# Patient Record
Sex: Female | Born: 1950 | Race: Black or African American | Hispanic: No | Smoking: Never smoker
Health system: Southern US, Community
[De-identification: ages and names within clinical notes are randomized; demographics above are authoritative.]

## PROBLEM LIST (undated history)

## (undated) DIAGNOSIS — I5041 Acute combined systolic (congestive) and diastolic (congestive) heart failure: Secondary | ICD-10-CM

## (undated) DIAGNOSIS — E785 Hyperlipidemia, unspecified: Secondary | ICD-10-CM

## (undated) DIAGNOSIS — I428 Other cardiomyopathies: Secondary | ICD-10-CM

## (undated) DIAGNOSIS — B029 Zoster without complications: Secondary | ICD-10-CM

## (undated) DIAGNOSIS — D72819 Decreased white blood cell count, unspecified: Secondary | ICD-10-CM

## (undated) DIAGNOSIS — I10 Essential (primary) hypertension: Secondary | ICD-10-CM

## (undated) DIAGNOSIS — I5042 Chronic combined systolic (congestive) and diastolic (congestive) heart failure: Secondary | ICD-10-CM

## (undated) DIAGNOSIS — R7303 Prediabetes: Secondary | ICD-10-CM

## (undated) HISTORY — DX: Hyperlipidemia, unspecified: E78.5

## (undated) HISTORY — DX: Prediabetes: R73.03

## (undated) HISTORY — PX: TUBAL LIGATION: SHX77

## (undated) HISTORY — DX: Decreased white blood cell count, unspecified: D72.819

## (undated) HISTORY — DX: Chronic combined systolic (congestive) and diastolic (congestive) heart failure: I50.42

## (undated) HISTORY — DX: Zoster without complications: B02.9

## (undated) HISTORY — DX: Other cardiomyopathies: I42.8

## (undated) HISTORY — DX: Acute combined systolic (congestive) and diastolic (congestive) heart failure: I50.41

---

## 2005-02-10 ENCOUNTER — Ambulatory Visit: Payer: Self-pay | Admitting: Gastroenterology

## 2005-03-18 ENCOUNTER — Other Ambulatory Visit: Admission: RE | Admit: 2005-03-18 | Discharge: 2005-03-18 | Payer: Self-pay | Admitting: Family Medicine

## 2005-04-01 ENCOUNTER — Ambulatory Visit (HOSPITAL_COMMUNITY): Admission: RE | Admit: 2005-04-01 | Discharge: 2005-04-01 | Payer: Self-pay | Admitting: Family Medicine

## 2006-08-06 ENCOUNTER — Other Ambulatory Visit: Admission: RE | Admit: 2006-08-06 | Discharge: 2006-08-06 | Payer: Self-pay | Admitting: Family Medicine

## 2006-08-19 ENCOUNTER — Ambulatory Visit (HOSPITAL_COMMUNITY): Admission: RE | Admit: 2006-08-19 | Discharge: 2006-08-19 | Payer: Self-pay | Admitting: Family Medicine

## 2008-03-12 ENCOUNTER — Ambulatory Visit (HOSPITAL_COMMUNITY): Admission: RE | Admit: 2008-03-12 | Discharge: 2008-03-12 | Payer: Self-pay | Admitting: Family Medicine

## 2009-05-01 ENCOUNTER — Other Ambulatory Visit: Admission: RE | Admit: 2009-05-01 | Discharge: 2009-05-01 | Payer: Self-pay | Admitting: Family Medicine

## 2009-10-11 ENCOUNTER — Encounter: Admission: RE | Admit: 2009-10-11 | Discharge: 2009-10-11 | Payer: Self-pay | Admitting: Family Medicine

## 2011-08-27 ENCOUNTER — Other Ambulatory Visit (HOSPITAL_COMMUNITY): Payer: Self-pay | Admitting: Family Medicine

## 2011-08-27 DIAGNOSIS — Z1231 Encounter for screening mammogram for malignant neoplasm of breast: Secondary | ICD-10-CM

## 2011-09-24 ENCOUNTER — Ambulatory Visit (HOSPITAL_COMMUNITY)
Admission: RE | Admit: 2011-09-24 | Discharge: 2011-09-24 | Disposition: A | Discharge status: Home / Self Care | Payer: Commercial Managed Care - PPO | Source: Ambulatory Visit | Attending: Family Medicine | Admitting: Family Medicine

## 2011-09-24 DIAGNOSIS — Z1231 Encounter for screening mammogram for malignant neoplasm of breast: Secondary | ICD-10-CM

## 2012-02-02 ENCOUNTER — Other Ambulatory Visit (HOSPITAL_COMMUNITY)
Admission: RE | Admit: 2012-02-02 | Discharge: 2012-02-02 | Disposition: A | Discharge status: Home / Self Care | Payer: 59 | Source: Ambulatory Visit | Attending: Family Medicine | Admitting: Family Medicine

## 2012-02-02 ENCOUNTER — Other Ambulatory Visit: Payer: Self-pay | Admitting: Family Medicine

## 2012-02-02 DIAGNOSIS — Z124 Encounter for screening for malignant neoplasm of cervix: Secondary | ICD-10-CM | POA: Insufficient documentation

## 2012-03-17 ENCOUNTER — Other Ambulatory Visit: Payer: Self-pay | Admitting: Gastroenterology

## 2013-06-28 ENCOUNTER — Telehealth: Payer: Self-pay | Admitting: Internal Medicine

## 2013-06-28 NOTE — Telephone Encounter (Signed)
S/w pt and gave np appt 01/26 @ 1:30 w/Dr. Arbutus Ped Referring Dr. Shirlean Mylar Dx-Lymphocytosis Welcome packet mailed.

## 2013-06-28 NOTE — Telephone Encounter (Signed)
LVOM FOR PT TO RETURN CALL IN RE TO REFERRAL.  °

## 2013-06-29 ENCOUNTER — Telehealth: Payer: Self-pay | Admitting: Internal Medicine

## 2013-06-29 NOTE — Telephone Encounter (Signed)
C/D 06/29/13 for appt. 07/03/13

## 2013-07-02 ENCOUNTER — Other Ambulatory Visit: Payer: Self-pay | Admitting: Internal Medicine

## 2013-07-02 DIAGNOSIS — D72819 Decreased white blood cell count, unspecified: Secondary | ICD-10-CM

## 2013-07-03 ENCOUNTER — Encounter: Payer: Self-pay | Admitting: Internal Medicine

## 2013-07-03 ENCOUNTER — Telehealth: Payer: Self-pay | Admitting: Internal Medicine

## 2013-07-03 ENCOUNTER — Ambulatory Visit (HOSPITAL_BASED_OUTPATIENT_CLINIC_OR_DEPARTMENT_OTHER): Payer: 59 | Admitting: Internal Medicine

## 2013-07-03 ENCOUNTER — Other Ambulatory Visit: Payer: Self-pay | Admitting: Medical Oncology

## 2013-07-03 ENCOUNTER — Ambulatory Visit (HOSPITAL_BASED_OUTPATIENT_CLINIC_OR_DEPARTMENT_OTHER): Payer: 59

## 2013-07-03 ENCOUNTER — Ambulatory Visit: Payer: 59

## 2013-07-03 VITALS — BP 144/82 | HR 79 | Temp 97.1°F | Resp 18 | Ht 66.0 in | Wt 192.9 lb

## 2013-07-03 DIAGNOSIS — D72819 Decreased white blood cell count, unspecified: Secondary | ICD-10-CM

## 2013-07-03 DIAGNOSIS — E876 Hypokalemia: Secondary | ICD-10-CM

## 2013-07-03 DIAGNOSIS — D72829 Elevated white blood cell count, unspecified: Secondary | ICD-10-CM

## 2013-07-03 DIAGNOSIS — D709 Neutropenia, unspecified: Secondary | ICD-10-CM

## 2013-07-03 LAB — HEPATITIS PANEL, ACUTE
HCV Ab: NEGATIVE
Hep A IgM: NONREACTIVE
Hep B C IgM: NONREACTIVE
Hepatitis B Surface Ag: NEGATIVE

## 2013-07-03 LAB — CBC WITH DIFFERENTIAL/PLATELET
BASO%: 0.4 % (ref 0.0–2.0)
Basophils Absolute: 0 10*3/uL (ref 0.0–0.1)
EOS%: 1.1 % (ref 0.0–7.0)
Eosinophils Absolute: 0 10*3/uL (ref 0.0–0.5)
HCT: 38.5 % (ref 34.8–46.6)
HGB: 12.6 g/dL (ref 11.6–15.9)
LYMPH%: 57.8 % — ABNORMAL HIGH (ref 14.0–49.7)
MCH: 31.1 pg (ref 25.1–34.0)
MCHC: 32.7 g/dL (ref 31.5–36.0)
MCV: 95 fL (ref 79.5–101.0)
MONO#: 0.5 10*3/uL (ref 0.1–0.9)
MONO%: 12.7 % (ref 0.0–14.0)
NEUT#: 1 10*3/uL — ABNORMAL LOW (ref 1.5–6.5)
NEUT%: 28 % — ABNORMAL LOW (ref 38.4–76.8)
Platelets: 270 10*3/uL (ref 145–400)
RBC: 4.05 10*6/uL (ref 3.70–5.45)
RDW: 14 % (ref 11.2–14.5)
WBC: 3.7 10*3/uL — ABNORMAL LOW (ref 3.9–10.3)
lymph#: 2.1 10*3/uL (ref 0.9–3.3)

## 2013-07-03 LAB — COMPREHENSIVE METABOLIC PANEL (CC13)
ALT: 23 U/L (ref 0–55)
AST: 22 U/L (ref 5–34)
Albumin: 3.9 g/dL (ref 3.5–5.0)
Alkaline Phosphatase: 83 U/L (ref 40–150)
Anion Gap: 12 mEq/L — ABNORMAL HIGH (ref 3–11)
BUN: 19 mg/dL (ref 7.0–26.0)
CO2: 29 mEq/L (ref 22–29)
Calcium: 9.5 mg/dL (ref 8.4–10.4)
Chloride: 103 mEq/L (ref 98–109)
Creatinine: 0.9 mg/dL (ref 0.6–1.1)
Glucose: 116 mg/dl (ref 70–140)
Potassium: 3 mEq/L — CL (ref 3.5–5.1)
Sodium: 144 mEq/L (ref 136–145)
Total Bilirubin: 0.65 mg/dL (ref 0.20–1.20)
Total Protein: 7.9 g/dL (ref 6.4–8.3)

## 2013-07-03 LAB — LACTATE DEHYDROGENASE (CC13): LDH: 166 U/L (ref 125–245)

## 2013-07-03 LAB — RHEUMATOID FACTOR: Rheumatoid fact SerPl-aCnc: <10 IU/mL (ref <=14)

## 2013-07-03 MED ORDER — POTASSIUM CHLORIDE CRYS ER 20 MEQ PO TBCR: 20.0000 meq | EXTENDED_RELEASE_TABLET | Freq: Every day | ORAL | Status: DC

## 2013-07-03 NOTE — Patient Instructions (Signed)
Follow up visit in 2 weeks for reevaluation

## 2013-07-03 NOTE — Telephone Encounter (Signed)
gv pt appt schedule for feb °

## 2013-07-03 NOTE — Progress Notes (Signed)
[place] [phone]   [phone]  CONSULT NOTE  REFERRING PHYSICIAN: Dr. Maurice Small  REASON FOR CONSULTATION:  63 years old African American female with Leukocytopenia.  HPI Denise Anderson is a 63 y.o. female with a past medical history significant for hypertension. She was recently started care with Dr. Justin Mend after her previous primary care physician left the practice. She was seen by Dr. Justin Mend in October of 2014 and CBC performed on 03/21/2013 showed total white blood count of 3.5 with absolute neutrophil count of 1300 and absolute lymphocyte count of 1600. Repeat CBC on 06/26/2013 showed persistent leukocytopenia with total white blood count of 3.9, absolute neutrophil count 1200 and absolute lymphocyte count 1900. The patient was referred to me today for evaluation and recommendation regarding these abnormalities. The patient is feeling fine and she has no significant complaints. She denied having any history of rheumatoid arthritis or lupus. She denied having any history of HIV or hepatitis. The patient has no significant weight loss or night sweats. She denied having any significant chest pain, shortness of breath, cough or hemoptysis. She has no nausea or vomiting. She has no fatigue or weakness.  PAST MEDICAL HISTORY: significant for hypertension, status post tubal ligation and history of shingles.  FAMILY HISTORY: Significant for father and mother died from heart attacks and brother had a stroke.  SOCIAL HISTORY: The patient is single and has 2 adult children. She works as a Statistician. She denied having any history of smoking but drinks alcohol occasionally and no history of drug abuse. HPI   ALLERGIES:  No Known Allergies  Current Outpatient Prescriptions  Medication Sig Dispense Refill  . atenolol-chlorthalidone (TENORETIC) 50-25 MG per tablet Take 1 tablet by mouth daily.      . Cholecalciferol (VITAMIN D3) 3000 UNITS TABS Take  2,000 mcg by mouth daily.      Marland Kitchen L-Lysine 1000 MG TABS Take 1,500 mg by mouth daily.      . Multiple Vitamin (MULTIVITAMIN) tablet Take 1 tablet by mouth daily.      . Multiple Vitamins-Minerals (OCUVITE ADULT 50+ PO) Take 1 tablet by mouth daily.      Marland Kitchen zinc gluconate 50 MG tablet Take 50 mg by mouth daily.       No current facility-administered medications for this visit.    Review of Systems  Constitutional: negative Eyes: negative Ears, nose, mouth, throat, and face: negative Respiratory: negative Cardiovascular: negative Gastrointestinal: negative Genitourinary:negative Integument/breast: negative Hematologic/lymphatic: negative Musculoskeletal:negative Neurological: negative Behavioral/Psych: negative Endocrine: negative Allergic/Immunologic: negative  Physical Exam  OZY:YQMGN, healthy, no distress, well nourished and well developed SKIN: skin color, texture, turgor are normal, no rashes or significant lesions HEAD: Normocephalic, No masses, lesions, tenderness or abnormalities EYES: normal, PERRLA EARS: External ears normal OROPHARYNX:no exudate, no erythema and lips, buccal mucosa, and tongue normal  NECK: supple, no adenopathy, no JVD LYMPH:  no palpable lymphadenopathy, no hepatosplenomegaly BREAST:not examined LUNGS: clear to auscultation , and palpation HEART: regular rate & rhythm, no murmurs and no gallops ABDOMEN:abdomen soft, non-tender, normal bowel sounds and no masses or organomegaly BACK: Back symmetric, no curvature. EXTREMITIES:no joint deformities, effusion, or inflammation, no edema, no skin discoloration  NEURO: alert & oriented x 3 with fluent speech, no focal motor/sensory deficits  PERFORMANCE STATUS: ECOG 0  LABORATORY DATA: Lab Results  Component Value Date   WBC 3.7* 07/03/2013   HGB 12.6 07/03/2013   HCT 38.5 07/03/2013   MCV 95.0 07/03/2013  PLT 270 07/03/2013      Chemistry      Component Value Date/Time   NA 144 07/03/2013 1403     K 3.0* 07/03/2013 1403   CO2 29 07/03/2013 1403   BUN 19.0 07/03/2013 1403   CREATININE 0.9 07/03/2013 1403      Component Value Date/Time   CALCIUM 9.5 07/03/2013 1403   ALKPHOS 83 07/03/2013 1403   AST 22 07/03/2013 1403   ALT 23 07/03/2013 1403   BILITOT 0.65 07/03/2013 1403       RADIOGRAPHIC STUDIES: No results found.  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 Other etiologies cannot be excluded at this point.  PLAN: I ordered several studies to evaluate her condition including repeat CBC, comprehensive metabolic panel, LDH, serum folate, vitamin B 12, ANA, rheumatoid factor, hepatitis panel and HIV antibody.  I would see the patient back for follow up visit in 2 weeks for evaluation and discussion of the lab results and further recommendation regarding her condition. I may consider her for a bone marrow biopsy and aspirate to rule out any other underlying bone marrow abnormality if she continues to have persistent leukocytopenia and neutropenia. The patient is currently asymptomatic and she would continue on observation for now. She was advised to call immediately if she has any concerning symptoms in the interval.  The patient voices understanding of current disease status and treatment options and is in agreement with the current care plan.  All questions were answered. The patient knows to call the clinic with any problems, questions or concerns. We can certainly see the patient much sooner if necessary.  Thank you so much for allowing me to participate in the care of Denise Anderson. I will continue to follow up the patient with you and assist in her care.  I spent 35 minutes counseling the patient face to face. The total time spent in the appointment was 55 minutes.  Disclaimer: This note was dictated with voice recognition software. Similar sounding words  can inadvertently be transcribed and may not be corrected upon review.   Gust Eugene K. 07/03/2013, 3:25 PM

## 2013-07-03 NOTE — Telephone Encounter (Signed)
Called in kdur and pt notified.

## 2013-07-04 LAB — FOLATE: Folate: >20 ng/mL

## 2013-07-04 LAB — ANTI-NUCLEAR AB-TITER (ANA TITER): ANA Titer 1: 1:40 {titer} — ABNORMAL HIGH

## 2013-07-04 LAB — VITAMIN B12: Vitamin B-12: 726 pg/mL (ref 211–911)

## 2013-07-04 LAB — ANA: Anti Nuclear Antibody(ANA): POSITIVE — AB

## 2013-07-04 LAB — HIV ANTIBODY (ROUTINE TESTING W REFLEX): HIV: NONREACTIVE

## 2013-07-10 ENCOUNTER — Ambulatory Visit: Payer: 59 | Admitting: Internal Medicine

## 2013-07-10 ENCOUNTER — Ambulatory Visit: Payer: 59

## 2013-07-10 ENCOUNTER — Other Ambulatory Visit: Payer: 59

## 2013-07-18 ENCOUNTER — Telehealth: Payer: Self-pay | Admitting: Internal Medicine

## 2013-07-18 ENCOUNTER — Ambulatory Visit (HOSPITAL_BASED_OUTPATIENT_CLINIC_OR_DEPARTMENT_OTHER): Payer: 59 | Admitting: Physician Assistant

## 2013-07-18 ENCOUNTER — Other Ambulatory Visit (HOSPITAL_BASED_OUTPATIENT_CLINIC_OR_DEPARTMENT_OTHER): Payer: 59

## 2013-07-18 ENCOUNTER — Encounter: Payer: Self-pay | Admitting: Physician Assistant

## 2013-07-18 VITALS — BP 139/87 | HR 79 | Temp 97.2°F | Resp 20 | Ht 66.0 in | Wt 186.6 lb

## 2013-07-18 DIAGNOSIS — D72819 Decreased white blood cell count, unspecified: Secondary | ICD-10-CM

## 2013-07-18 DIAGNOSIS — D72820 Lymphocytosis (symptomatic): Secondary | ICD-10-CM

## 2013-07-18 DIAGNOSIS — D709 Neutropenia, unspecified: Secondary | ICD-10-CM

## 2013-07-18 DIAGNOSIS — D61818 Other pancytopenia: Secondary | ICD-10-CM

## 2013-07-18 LAB — CBC WITH DIFFERENTIAL/PLATELET
BASO%: 0.3 % (ref 0.0–2.0)
Basophils Absolute: 0 10*3/uL (ref 0.0–0.1)
EOS%: 0.8 % (ref 0.0–7.0)
Eosinophils Absolute: 0 10*3/uL (ref 0.0–0.5)
HCT: 37.4 % (ref 34.8–46.6)
HGB: 12.3 g/dL (ref 11.6–15.9)
LYMPH%: 50 % — ABNORMAL HIGH (ref 14.0–49.7)
MCH: 31.4 pg (ref 25.1–34.0)
MCHC: 33 g/dL (ref 31.5–36.0)
MCV: 95 fL (ref 79.5–101.0)
MONO#: 0.7 10*3/uL (ref 0.1–0.9)
MONO%: 20.3 % — ABNORMAL HIGH (ref 0.0–14.0)
NEUT#: 1 10*3/uL — ABNORMAL LOW (ref 1.5–6.5)
NEUT%: 28.6 % — ABNORMAL LOW (ref 38.4–76.8)
Platelets: 258 10*3/uL (ref 145–400)
RBC: 3.93 10*6/uL (ref 3.70–5.45)
RDW: 14 % (ref 11.2–14.5)
WBC: 3.4 10*3/uL — ABNORMAL LOW (ref 3.9–10.3)
lymph#: 1.7 10*3/uL (ref 0.9–3.3)

## 2013-07-18 NOTE — Progress Notes (Addendum)
No images are attached to the encounter. No scans are attached to the encounter. No scans are attached to the encounter. [place] SHARED VISIT PROGRESS NOTE  WEBB, Denise Leaver, MD [address] [place] [address]  DIAGNOSIS: Leukocytopenia and neutropenia with relative lymphocytosis  PRIOR THERAPY: None  CURRENT THERAPY: Observation  INTERVAL HISTORY: Denise Anderson 63 y.o. 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 She was evaluated by Dr. Julien Anderson regarding her history of leukopenia and neutropenia. She presents today to discuss the results of several laboratory studies that were drawn as well as further management of her condition. She feels well and has started working out at [employer] and is working on losing some weight. She's had no issues with night sweats has noticed no lymph node swelling no significant weight gain or weight loss. She is content to remain on observation and does not wish to pursue bone marrow biopsy at this time. Of note the laboratory studies including LDH serum folate vitamin B 12 rheumatoid factor hepatitis panel anytime the panel are were all negative. Her ANA came back only marginally positive with a titer of 1:40, which is nonspecific.  MEDICAL HISTORY:History reviewed. No pertinent past medical history.  ALLERGIES:  has No Known Allergies.  MEDICATIONS:  Current Outpatient Prescriptions  Medication Sig Dispense Refill  . atenolol-chlorthalidone (TENORETIC) 50-25 MG per tablet Take 1 tablet by mouth daily.      . Cholecalciferol (VITAMIN D3) 3000 UNITS TABS Take 2,000 mcg by mouth daily.      Marland Kitchen L-Lysine 1000 MG TABS Take 1,500 mg by mouth daily.      . Multiple Vitamin (MULTIVITAMIN) tablet Take 1 tablet by mouth daily.      . Multiple Vitamins-Minerals (OCUVITE ADULT 50+ PO) Take 1 tablet by mouth daily.      Marland Kitchen zinc gluconate 50 MG tablet Take 50 mg by mouth daily.      .  potassium chloride SA (K-DUR,KLOR-CON) 20 MEQ tablet Take 1 tablet (20 mEq total) by mouth daily.  7 tablet  0   No current facility-administered medications for this visit.    SURGICAL HISTORY: History reviewed. No pertinent past surgical history.  REVIEW OF SYSTEMS:  Constitutional: negative Eyes: negative Ears, nose, mouth, throat, and face: negative Respiratory: negative Cardiovascular: negative Gastrointestinal: negative Genitourinary:negative Integument/breast: negative Hematologic/lymphatic: negative Musculoskeletal:negative Neurological: negative Behavioral/Psych: negative Endocrine: negative Allergic/Immunologic: negative   PHYSICAL EXAMINATION: General appearance: alert, cooperative, no distress and appears younger than stated age Head: Normocephalic, without obvious abnormality, atraumatic Neck: no adenopathy, no carotid bruit, no JVD, supple, symmetrical, trachea midline and thyroid not enlarged, symmetric, no tenderness/mass/nodules Lymph nodes: Cervical, supraclavicular, and axillary nodes normal. Resp: clear to auscultation bilaterally Back: symmetric, no curvature. ROM normal. No CVA tenderness. Cardio: regular rate and rhythm, S1, S2 normal, no murmur, click, rub or gallop GI: soft, non-tender; bowel sounds normal; no masses,  no organomegaly Extremities: extremities normal, atraumatic, no cyanosis or edema Neurologic: Alert and oriented X 3, normal strength and tone. Normal symmetric reflexes. Normal coordination and gait  ECOG PERFORMANCE STATUS: 0 - Asymptomatic  Blood pressure 139/87, pulse 79, temperature 97.2 F (36.2 C), temperature source Oral, resp. rate 20, height '5\' 6"'  (1.676 m), weight 186 lb 9.6 oz (84.641 kg), SpO2 99.00%.  LABORATORY DATA: Lab Results  Component Value Date   WBC 3.4* 07/18/2013   HGB 12.3 07/18/2013   HCT 37.4 07/18/2013   MCV  95.0 07/18/2013   PLT 258 07/18/2013      Chemistry      Component Value Date/Time   NA 144 07/03/2013  1403   K 3.0* 07/03/2013 1403   CO2 29 07/03/2013 1403   BUN 19.0 07/03/2013 1403   CREATININE 0.9 07/03/2013 1403      Component Value Date/Time   CALCIUM 9.5 07/03/2013 1403   ALKPHOS 83 07/03/2013 1403   AST 22 07/03/2013 1403   ALT 23 07/03/2013 1403   BILITOT 0.65 07/03/2013 1403       RADIOGRAPHIC STUDIES:  No results found.   ASSESSMENT/PLAN: The patient is a very pleasant 63 year old [place] American female with a history of lymphocytosis. She's had no issues with fever or or prolonged illnesses. The laboratory study results were reviewed the patient. The patient was discussed with an also seen by Dr. Julien Anderson. We will plan to keep the patient on observation and have her return in 3 months with a repeat CBC differential and LDH. We will monitor the trend of her white count and absolute neutrophil count. If these continue to to trend downward the patient understands the bone marrow biopsy will be necessary. She is in agreement with this plan and will return in 3 months for another symptom management visit and repeat labs as stated above.     Denise Anderson, Denise Krakow E, PA-C     All questions were answered. The patient knows to call the clinic with any problems, questions or concerns. We can certainly see the patient much sooner if necessary.  ADDENDUM: Hematology/Oncology Attending: I had the face to face encounter with the patient today. I recommended her care plan. This is a very pleasant 63 years old [place] American female evaluated for pancytopenia and neutropenia with relative lymphocytosis. The previous bloodwork that was performed to evaluate her condition were negative except for mildly positive ANA. I discussed with the patient her lab results and give her the option of continuous observation versus proceeding with a bone marrow biopsy and aspirate. The patient would like to continue on observation for now. I would see her back for followup visit in 3 months with repeat CBC and  LDH. She was advised to call immediately she has any concerning symptoms in the interval.  Disclaimer: This note was dictated with voice recognition software. Similar sounding words can inadvertently be transcribed and may not be corrected upon review. Eilleen Kempf., MD 07/18/2013

## 2013-07-18 NOTE — Patient Instructions (Signed)
Follow up in 3 months

## 2013-07-18 NOTE — Telephone Encounter (Signed)
gave pt appt for lab and md on May 2015

## 2013-10-17 ENCOUNTER — Ambulatory Visit (HOSPITAL_BASED_OUTPATIENT_CLINIC_OR_DEPARTMENT_OTHER): Payer: 59 | Admitting: Internal Medicine

## 2013-10-17 ENCOUNTER — Telehealth: Payer: Self-pay | Admitting: Internal Medicine

## 2013-10-17 ENCOUNTER — Encounter: Payer: Self-pay | Admitting: Internal Medicine

## 2013-10-17 ENCOUNTER — Other Ambulatory Visit (HOSPITAL_BASED_OUTPATIENT_CLINIC_OR_DEPARTMENT_OTHER): Payer: 59

## 2013-10-17 VITALS — BP 140/80 | HR 67 | Temp 98.1°F | Resp 18 | Ht 66.0 in | Wt 169.1 lb

## 2013-10-17 DIAGNOSIS — D709 Neutropenia, unspecified: Secondary | ICD-10-CM

## 2013-10-17 DIAGNOSIS — D72820 Lymphocytosis (symptomatic): Secondary | ICD-10-CM

## 2013-10-17 DIAGNOSIS — D72819 Decreased white blood cell count, unspecified: Secondary | ICD-10-CM

## 2013-10-17 LAB — CBC WITH DIFFERENTIAL/PLATELET
BASO%: 0.3 % (ref 0.0–2.0)
Basophils Absolute: 0 10*3/uL (ref 0.0–0.1)
EOS%: 1.3 % (ref 0.0–7.0)
Eosinophils Absolute: 0 10*3/uL (ref 0.0–0.5)
HCT: 39.3 % (ref 34.8–46.6)
HGB: 13 g/dL (ref 11.6–15.9)
LYMPH%: 47.9 % (ref 14.0–49.7)
MCH: 30.7 pg (ref 25.1–34.0)
MCHC: 33.1 g/dL (ref 31.5–36.0)
MCV: 92.9 fL (ref 79.5–101.0)
MONO#: 0.6 10*3/uL (ref 0.1–0.9)
MONO%: 18.5 % — ABNORMAL HIGH (ref 0.0–14.0)
NEUT#: 1 10*3/uL — ABNORMAL LOW (ref 1.5–6.5)
NEUT%: 32 % — ABNORMAL LOW (ref 38.4–76.8)
Platelets: 262 10*3/uL (ref 145–400)
RBC: 4.23 10*6/uL (ref 3.70–5.45)
RDW: 13.6 % (ref 11.2–14.5)
WBC: 3 10*3/uL — ABNORMAL LOW (ref 3.9–10.3)
lymph#: 1.5 10*3/uL (ref 0.9–3.3)

## 2013-10-17 LAB — LACTATE DEHYDROGENASE (CC13): LDH: 151 U/L (ref 125–245)

## 2013-10-17 NOTE — Progress Notes (Signed)
[place] [phone]   [phone]  OFFICE PROGRESS NOTE  Jonathon Bellows, MD [address] Denise Anderson [address]  DIAGNOSIS: Leukocytopenia and neutropenia with relative lymphocytosis   PRIOR THERAPY: None   CURRENT THERAPY: Observation  INTERVAL HISTORY: Denise Anderson 63 y.o. female returns to the clinic today for routine three-month followup visit. The patient is feeling fine today with no specific complaints. She denied having any significant fever or chills. She has no recurrent infection. She has no chest pain, shortness breath, cough or hemoptysis. The patient denied having any significant weight loss or night sweats. She is very active and goes to the gym at regular basis for exercise. She had repeat CBC performed earlier today and she is here for evaluation and discussion of her lab results.   ALLERGIES:  has No Known Allergies.  MEDICATIONS:  Current Outpatient Prescriptions  Medication Sig Dispense Refill  . atenolol-chlorthalidone (TENORETIC) 50-25 MG per tablet Take 1 tablet by mouth daily.      . Cholecalciferol (VITAMIN D3) 3000 UNITS TABS Take 2,000 mcg by mouth daily.      Marland Kitchen L-Lysine 1000 MG TABS Take 1,500 mg by mouth daily.      . Multiple Vitamin (MULTIVITAMIN) tablet Take 1 tablet by mouth daily.      . Multiple Vitamins-Minerals (OCUVITE ADULT 50+ PO) Take 1 tablet by mouth daily.      . potassium chloride SA (K-DUR,KLOR-CON) 20 MEQ tablet Take 1 tablet (20 mEq total) by mouth daily.  7 tablet  0  . zinc gluconate 50 MG tablet Take 50 mg by mouth daily.       No current facility-administered medications for this visit.    REVIEW OF SYSTEMS:  A comprehensive review of systems was negative.   PHYSICAL EXAMINATION: General appearance: alert, cooperative and no distress Head: Normocephalic, without obvious abnormality, atraumatic Neck: no adenopathy, no JVD, supple, symmetrical, trachea midline and  thyroid not enlarged, symmetric, no tenderness/mass/nodules Lymph nodes: Cervical, supraclavicular, and axillary nodes normal. Resp: clear to auscultation bilaterally Back: symmetric, no curvature. ROM normal. No CVA tenderness. Cardio: regular rate and rhythm, S1, S2 normal, no murmur, click, rub or gallop GI: soft, non-tender; bowel sounds normal; no masses,  no organomegaly Extremities: extremities normal, atraumatic, no cyanosis or edema  ECOG PERFORMANCE STATUS: 0 - Asymptomatic  There were no vitals taken for this visit.  LABORATORY DATA: Lab Results  Component Value Date   WBC 3.0* 10/17/2013   HGB 13.0 10/17/2013   HCT 39.3 10/17/2013   MCV 92.9 10/17/2013   PLT 262 10/17/2013      Chemistry      Component Value Date/Time   NA 144 07/03/2013 1403   K 3.0* 07/03/2013 1403   CO2 29 07/03/2013 1403   BUN 19.0 07/03/2013 1403   CREATININE 0.9 07/03/2013 1403      Component Value Date/Time   CALCIUM 9.5 07/03/2013 1403   ALKPHOS 83 07/03/2013 1403   AST 22 07/03/2013 1403   ALT 23 07/03/2013 1403   BILITOT 0.65 07/03/2013 1403       RADIOGRAPHIC STUDIES: No results found.  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 I discussed with the patient again today proceeding with a bone marrow biopsy and aspirate but she declined. I will continue her on observation with repeat CBC and LDH in 3 months.  She was advised to call me immediately if she has any concerning symptoms and interval. The patient voices understanding of current disease status and treatment options and is in agreement with the current care plan.  All questions were answered. The patient knows to call the clinic with any problems, questions or concerns. We can certainly see the patient much sooner if necessary.  Disclaimer: This note was dictated with voice recognition software. Similar sounding words can  inadvertently be transcribed and may not be corrected upon review.

## 2013-10-17 NOTE — Telephone Encounter (Signed)
per pof to sch appt-gave pt copy of appt °

## 2014-01-17 ENCOUNTER — Ambulatory Visit (HOSPITAL_BASED_OUTPATIENT_CLINIC_OR_DEPARTMENT_OTHER): Payer: 59 | Admitting: Internal Medicine

## 2014-01-17 ENCOUNTER — Telehealth: Payer: Self-pay | Admitting: Internal Medicine

## 2014-01-17 ENCOUNTER — Other Ambulatory Visit (HOSPITAL_BASED_OUTPATIENT_CLINIC_OR_DEPARTMENT_OTHER): Payer: 59

## 2014-01-17 ENCOUNTER — Encounter: Payer: Self-pay | Admitting: Internal Medicine

## 2014-01-17 VITALS — BP 125/73 | HR 68 | Temp 98.2°F | Resp 18 | Ht 66.0 in | Wt 168.4 lb

## 2014-01-17 DIAGNOSIS — D72819 Decreased white blood cell count, unspecified: Secondary | ICD-10-CM

## 2014-01-17 DIAGNOSIS — D709 Neutropenia, unspecified: Secondary | ICD-10-CM

## 2014-01-17 LAB — CBC WITH DIFFERENTIAL/PLATELET
BASO%: 0.3 % (ref 0.0–2.0)
Basophils Absolute: 0 10*3/uL (ref 0.0–0.1)
EOS%: 0.9 % (ref 0.0–7.0)
Eosinophils Absolute: 0 10*3/uL (ref 0.0–0.5)
HCT: 38.9 % (ref 34.8–46.6)
HGB: 12.6 g/dL (ref 11.6–15.9)
LYMPH%: 52.3 % — ABNORMAL HIGH (ref 14.0–49.7)
MCH: 30.7 pg (ref 25.1–34.0)
MCHC: 32.4 g/dL (ref 31.5–36.0)
MCV: 95 fL (ref 79.5–101.0)
MONO#: 0.5 10*3/uL (ref 0.1–0.9)
MONO%: 14.4 % — ABNORMAL HIGH (ref 0.0–14.0)
NEUT#: 1.1 10*3/uL — ABNORMAL LOW (ref 1.5–6.5)
NEUT%: 32.1 % — ABNORMAL LOW (ref 38.4–76.8)
Platelets: 292 10*3/uL (ref 145–400)
RBC: 4.09 10*6/uL (ref 3.70–5.45)
RDW: 14.3 % (ref 11.2–14.5)
WBC: 3.4 10*3/uL — ABNORMAL LOW (ref 3.9–10.3)
lymph#: 1.8 10*3/uL (ref 0.9–3.3)

## 2014-01-17 LAB — LACTATE DEHYDROGENASE (CC13): LDH: 144 U/L (ref 125–245)

## 2014-01-17 NOTE — Telephone Encounter (Signed)
gv and printed appt sched and avs for pt for Feb 2016 °

## 2014-01-17 NOTE — Progress Notes (Signed)
[facility] [phone]   [phone]  OFFICE PROGRESS NOTE  Frederich ChickWEBB, CAROL, D, MD [address] [place] [id number]  DIAGNOSIS: Leukocytopenia and neutropenia with relative lymphocytosis   PRIOR THERAPY: None   CURRENT THERAPY: Observation  INTERVAL HISTORY: Denise Anderson 63 y.o. female returns to the clinic today for routine three-month followup visit. The patient is feeling fine today with no specific complaints. She denied having any significant fever or chills. She has no recent infection. She has no chest pain, shortness breath, cough or hemoptysis. The patient denied having any significant weight loss or night sweats. She had repeat CBC performed earlier today and she is here for evaluation and discussion of her lab results.   ALLERGIES:  has No Known Allergies.  MEDICATIONS:  Current Outpatient Prescriptions  Medication Sig Dispense Refill  . atenolol-chlorthalidone (TENORETIC) 50-25 MG per tablet Take 1 tablet by mouth daily.      Marland Kitchen. L-Lysine 1000 MG TABS Take 1,500 mg by mouth daily.      . Multiple Vitamin (MULTIVITAMIN) tablet Take 1 tablet by mouth daily.      . Multiple Vitamins-Minerals (OCUVITE ADULT 50+ PO) Take 1 tablet by mouth daily.      . Cholecalciferol (VITAMIN D3) 3000 UNITS TABS Take 2,000 mcg by mouth daily.      . potassium chloride SA (K-DUR,KLOR-CON) 20 MEQ tablet Take 1 tablet (20 mEq total) by mouth daily.  7 tablet  0  . zinc gluconate 50 MG tablet Take 50 mg by mouth daily.       No current facility-administered medications for this visit.    REVIEW OF SYSTEMS:  A comprehensive review of systems was negative.   PHYSICAL EXAMINATION: General appearance: alert, cooperative and no distress Head: Normocephalic, without obvious abnormality, atraumatic Neck: no adenopathy, no JVD, supple, symmetrical, trachea midline and thyroid not enlarged, symmetric, no tenderness/mass/nodules Lymph nodes:  Cervical, supraclavicular, and axillary nodes normal. Resp: clear to auscultation bilaterally Back: symmetric, no curvature. ROM normal. No CVA tenderness. Cardio: regular rate and rhythm, S1, S2 normal, no murmur, click, rub or gallop GI: soft, non-tender; bowel sounds normal; no masses,  no organomegaly Extremities: extremities normal, atraumatic, no cyanosis or edema  ECOG PERFORMANCE STATUS: 0 - Asymptomatic  Blood pressure 125/73, pulse 68, temperature 98.2 F (36.8 C), resp. rate 18, height 5\' 6"  (1.676 m), weight 168 lb 6.4 oz (76.386 kg), SpO2 100.00%.  LABORATORY DATA: Lab Results  Component Value Date   WBC 3.4* 01/17/2014   HGB 12.6 01/17/2014   HCT 38.9 01/17/2014   MCV 95.0 01/17/2014   PLT 292 01/17/2014      Chemistry      Component Value Date/Time   NA 144 07/03/2013 1403   K 3.0* 07/03/2013 1403   CO2 29 07/03/2013 1403   BUN 19.0 07/03/2013 1403   CREATININE 0.9 07/03/2013 1403      Component Value Date/Time   CALCIUM 9.5 07/03/2013 1403   ALKPHOS 83 07/03/2013 1403   AST 22 07/03/2013 1403   ALT 23 07/03/2013 1403   BILITOT 0.65 07/03/2013 1403       RADIOGRAPHIC STUDIES: No results found.  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 Her CBC is stable today but still showing mild Leukocytopenia and neutropenia I will continue her on observation with repeat CBC, comprehensive metabolic panel and LDH in  6 months. She was advised to call me immediately if she has any concerning symptoms and interval. The patient voices understanding of current disease status and treatment options and is in agreement with the current care plan.  All questions were answered. The patient knows to call the clinic with any problems, questions or concerns. We can certainly see the patient much sooner if necessary.  Disclaimer: This note was dictated with voice recognition software.  Similar sounding words can inadvertently be transcribed and may not be corrected upon review.

## 2014-07-18 ENCOUNTER — Other Ambulatory Visit (HOSPITAL_BASED_OUTPATIENT_CLINIC_OR_DEPARTMENT_OTHER): Payer: Commercial Managed Care - PPO

## 2014-07-18 ENCOUNTER — Encounter: Payer: Self-pay | Admitting: Internal Medicine

## 2014-07-18 ENCOUNTER — Ambulatory Visit (HOSPITAL_BASED_OUTPATIENT_CLINIC_OR_DEPARTMENT_OTHER): Payer: Commercial Managed Care - PPO | Admitting: Internal Medicine

## 2014-07-18 VITALS — BP 142/96 | HR 58 | Temp 98.1°F | Resp 18 | Ht 66.0 in | Wt 185.7 lb

## 2014-07-18 DIAGNOSIS — D709 Neutropenia, unspecified: Secondary | ICD-10-CM

## 2014-07-18 DIAGNOSIS — D72819 Decreased white blood cell count, unspecified: Secondary | ICD-10-CM

## 2014-07-18 LAB — COMPREHENSIVE METABOLIC PANEL (CC13)
ALBUMIN: 3.6 g/dL (ref 3.5–5.0)
ALK PHOS: 78 U/L (ref 40–150)
ALT: 15 U/L (ref 0–55)
ANION GAP: 10 meq/L (ref 3–11)
AST: 18 U/L (ref 5–34)
BUN: 17 mg/dL (ref 7.0–26.0)
CO2: 29 mEq/L (ref 22–29)
Calcium: 9.4 mg/dL (ref 8.4–10.4)
Chloride: 103 mEq/L (ref 98–109)
Creatinine: 0.9 mg/dL (ref 0.6–1.1)
EGFR: 74 mL/min/{1.73_m2} — ABNORMAL LOW (ref >90)
Glucose: 96 mg/dl (ref 70–140)
POTASSIUM: 4 meq/L (ref 3.5–5.1)
Sodium: 143 mEq/L (ref 136–145)
TOTAL PROTEIN: 7.3 g/dL (ref 6.4–8.3)
Total Bilirubin: 0.67 mg/dL (ref 0.20–1.20)

## 2014-07-18 LAB — CBC WITH DIFFERENTIAL/PLATELET
BASO%: 0.5 % (ref 0.0–2.0)
Basophils Absolute: 0 10*3/uL (ref 0.0–0.1)
EOS%: 1.7 % (ref 0.0–7.0)
Eosinophils Absolute: 0.1 10*3/uL (ref 0.0–0.5)
HCT: 38.7 % (ref 34.8–46.6)
HEMOGLOBIN: 12.5 g/dL (ref 11.6–15.9)
LYMPH%: 49.4 % (ref 14.0–49.7)
MCH: 30.2 pg (ref 25.1–34.0)
MCHC: 32.4 g/dL (ref 31.5–36.0)
MCV: 93.2 fL (ref 79.5–101.0)
MONO#: 0.5 10*3/uL (ref 0.1–0.9)
MONO%: 15.9 % — ABNORMAL HIGH (ref 0.0–14.0)
NEUT#: 1.1 10*3/uL — ABNORMAL LOW (ref 1.5–6.5)
NEUT%: 32.5 % — ABNORMAL LOW (ref 38.4–76.8)
Platelets: 268 10*3/uL (ref 145–400)
RBC: 4.15 10*6/uL (ref 3.70–5.45)
RDW: 13.9 % (ref 11.2–14.5)
WBC: 3.3 10*3/uL — ABNORMAL LOW (ref 3.9–10.3)
lymph#: 1.6 10*3/uL (ref 0.9–3.3)

## 2014-07-18 LAB — LACTATE DEHYDROGENASE (CC13): LDH: 167 U/L (ref 125–245)

## 2014-07-18 NOTE — Progress Notes (Signed)
[facility] [phone]   [phone]  OFFICE PROGRESS NOTE  Frederich Chick, MD [address] [place] [address]  DIAGNOSIS: Leukocytopenia and neutropenia with relative lymphocytosis   PRIOR THERAPY: None   CURRENT THERAPY: Observation  INTERVAL HISTORY: Denise Anderson 64 y.o. female returns to the clinic today for routine six-month followup visit. The patient is feeling fine today with no specific complaints. She denied having any significant fever or chills. She has no recent infection. She has no chest pain, shortness breath, cough or hemoptysis. The patient denied having any significant weight loss or night sweats. She had repeat CBC performed earlier today and she is here for evaluation and discussion of her lab results.   ALLERGIES:  has No Known Allergies.  MEDICATIONS:  Current Outpatient Prescriptions  Medication Sig Dispense Refill  . atenolol-chlorthalidone (TENORETIC) 50-25 MG per tablet Take 1 tablet by mouth daily.    . Multiple Vitamin (MULTIVITAMIN) tablet Take 1 tablet by mouth daily.     No current facility-administered medications for this visit.    REVIEW OF SYSTEMS:  A comprehensive review of systems was negative.   PHYSICAL EXAMINATION: General appearance: alert, cooperative and no distress Head: Normocephalic, without obvious abnormality, atraumatic Neck: no adenopathy, no JVD, supple, symmetrical, trachea midline and thyroid not enlarged, symmetric, no tenderness/mass/nodules Lymph nodes: Cervical, supraclavicular, and axillary nodes normal. Resp: clear to auscultation bilaterally Back: symmetric, no curvature. ROM normal. No CVA tenderness. Cardio: regular rate and rhythm, S1, S2 normal, no murmur, click, rub or gallop GI: soft, non-tender; bowel sounds normal; no masses,  no organomegaly Extremities: extremities normal, atraumatic, no cyanosis or edema  ECOG PERFORMANCE STATUS: 0 -  Asymptomatic  Blood pressure 142/96, pulse 58, temperature 98.1 F (36.7 C), temperature source Oral, resp. rate 18, height 5\' 6"  (1.676 m), weight 185 lb 11.2 oz (84.233 kg), SpO2 100 %.  LABORATORY DATA: Lab Results  Component Value Date   WBC 3.3* 07/18/2014   HGB 12.5 07/18/2014   HCT 38.7 07/18/2014   MCV 93.2 07/18/2014   PLT 268 07/18/2014      Chemistry      Component Value Date/Time   NA 144 07/03/2013 1403   K 3.0* 07/03/2013 1403   CO2 29 07/03/2013 1403   BUN 19.0 07/03/2013 1403   CREATININE 0.9 07/03/2013 1403      Component Value Date/Time   CALCIUM 9.5 07/03/2013 1403   ALKPHOS 83 07/03/2013 1403   AST 22 07/03/2013 1403   ALT 23 07/03/2013 1403   BILITOT 0.65 07/03/2013 1403       RADIOGRAPHIC STUDIES: No results found.  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 Her CBC is stable today with mild Leukocytopenia and neutropenia. I recommended for the patient to continue on observation with routine follow-up visit with her primary care physician. I would see her on as-needed basis at this point. She was advised to call me immediately if she has any concerning symptoms and interval. The patient voices understanding of current disease status and treatment options and is in agreement with the current care plan.  All questions were answered. The patient knows to call the clinic with any problems, questions or concerns. We can certainly see the patient much sooner if necessary.  Disclaimer: This note was dictated with voice recognition software. Similar sounding words can inadvertently be transcribed and  may not be corrected upon review.

## 2015-03-26 ENCOUNTER — Other Ambulatory Visit: Payer: Self-pay

## 2015-03-26 DIAGNOSIS — Z1231 Encounter for screening mammogram for malignant neoplasm of breast: Secondary | ICD-10-CM

## 2015-03-28 ENCOUNTER — Other Ambulatory Visit: Payer: Self-pay | Admitting: Family Medicine

## 2015-03-28 ENCOUNTER — Other Ambulatory Visit (HOSPITAL_COMMUNITY)
Admission: RE | Admit: 2015-03-28 | Discharge: 2015-03-28 | Disposition: A | Discharge status: Home / Self Care | Payer: Commercial Managed Care - PPO | Source: Ambulatory Visit | Attending: Family Medicine | Admitting: Family Medicine

## 2015-03-28 DIAGNOSIS — Z1151 Encounter for screening for human papillomavirus (HPV): Secondary | ICD-10-CM | POA: Diagnosis not present

## 2015-03-28 DIAGNOSIS — Z01419 Encounter for gynecological examination (general) (routine) without abnormal findings: Secondary | ICD-10-CM | POA: Diagnosis present

## 2015-04-01 LAB — CYTOLOGY - PAP

## 2015-04-16 ENCOUNTER — Ambulatory Visit
Admission: RE | Admit: 2015-04-16 | Discharge: 2015-04-16 | Disposition: A | Discharge status: Home / Self Care | Payer: Commercial Managed Care - PPO | Source: Ambulatory Visit

## 2015-04-16 DIAGNOSIS — Z1231 Encounter for screening mammogram for malignant neoplasm of breast: Secondary | ICD-10-CM

## 2017-02-12 DIAGNOSIS — R0602 Shortness of breath: Secondary | ICD-10-CM | POA: Diagnosis not present

## 2017-02-15 ENCOUNTER — Ambulatory Visit (INDEPENDENT_AMBULATORY_CARE_PROVIDER_SITE_OTHER)
Admission: EM | Admit: 2017-02-15 | Discharge: 2017-02-15 | Disposition: A | Discharge status: Home / Self Care | Payer: Medicare Other | Source: Home / Self Care

## 2017-02-15 ENCOUNTER — Emergency Department (HOSPITAL_COMMUNITY): Payer: Medicare Other

## 2017-02-15 ENCOUNTER — Encounter (HOSPITAL_COMMUNITY): Payer: Self-pay | Admitting: Emergency Medicine

## 2017-02-15 ENCOUNTER — Inpatient Hospital Stay (HOSPITAL_COMMUNITY)
Admission: EM | Admit: 2017-02-15 | Discharge: 2017-02-17 | DRG: 293 | Disposition: A | Discharge status: Home / Self Care | Payer: Medicare Other | Attending: Cardiovascular Disease | Admitting: Cardiovascular Disease

## 2017-02-15 ENCOUNTER — Other Ambulatory Visit: Payer: Self-pay

## 2017-02-15 ENCOUNTER — Encounter (HOSPITAL_COMMUNITY): Payer: Self-pay | Admitting: *Deleted

## 2017-02-15 DIAGNOSIS — R079 Chest pain, unspecified: Secondary | ICD-10-CM | POA: Diagnosis not present

## 2017-02-15 DIAGNOSIS — I509 Heart failure, unspecified: Secondary | ICD-10-CM

## 2017-02-15 DIAGNOSIS — R1310 Dysphagia, unspecified: Secondary | ICD-10-CM | POA: Diagnosis present

## 2017-02-15 DIAGNOSIS — I10 Essential (primary) hypertension: Secondary | ICD-10-CM | POA: Diagnosis not present

## 2017-02-15 DIAGNOSIS — I5021 Acute systolic (congestive) heart failure: Secondary | ICD-10-CM | POA: Diagnosis not present

## 2017-02-15 DIAGNOSIS — I429 Cardiomyopathy, unspecified: Secondary | ICD-10-CM | POA: Diagnosis present

## 2017-02-15 DIAGNOSIS — R9431 Abnormal electrocardiogram [ECG] [EKG]: Secondary | ICD-10-CM

## 2017-02-15 DIAGNOSIS — I361 Nonrheumatic tricuspid (valve) insufficiency: Secondary | ICD-10-CM | POA: Diagnosis not present

## 2017-02-15 DIAGNOSIS — R0602 Shortness of breath: Secondary | ICD-10-CM | POA: Diagnosis not present

## 2017-02-15 DIAGNOSIS — Z8249 Family history of ischemic heart disease and other diseases of the circulatory system: Secondary | ICD-10-CM

## 2017-02-15 DIAGNOSIS — R109 Unspecified abdominal pain: Secondary | ICD-10-CM | POA: Diagnosis not present

## 2017-02-15 DIAGNOSIS — I5041 Acute combined systolic (congestive) and diastolic (congestive) heart failure: Secondary | ICD-10-CM

## 2017-02-15 DIAGNOSIS — R0789 Other chest pain: Secondary | ICD-10-CM

## 2017-02-15 DIAGNOSIS — Z79899 Other long term (current) drug therapy: Secondary | ICD-10-CM | POA: Diagnosis not present

## 2017-02-15 DIAGNOSIS — I110 Hypertensive heart disease with heart failure: Principal | ICD-10-CM | POA: Diagnosis present

## 2017-02-15 DIAGNOSIS — Z9114 Patient's other noncompliance with medication regimen: Secondary | ICD-10-CM

## 2017-02-15 DIAGNOSIS — I5042 Chronic combined systolic (congestive) and diastolic (congestive) heart failure: Secondary | ICD-10-CM

## 2017-02-15 HISTORY — DX: Essential (primary) hypertension: I10

## 2017-02-15 HISTORY — DX: Acute combined systolic (congestive) and diastolic (congestive) heart failure: I50.41

## 2017-02-15 LAB — CBC
HCT: 37.8 % (ref 36.0–46.0)
Hemoglobin: 12.6 g/dL (ref 12.0–15.0)
MCH: 30 pg (ref 26.0–34.0)
MCHC: 33.3 g/dL (ref 30.0–36.0)
MCV: 90 fL (ref 78.0–100.0)
PLATELETS: 222 10*3/uL (ref 150–400)
RBC: 4.2 MIL/uL (ref 3.87–5.11)
RDW: 15 % (ref 11.5–15.5)
WBC: 5.4 10*3/uL (ref 4.0–10.5)

## 2017-02-15 LAB — BASIC METABOLIC PANEL
Anion gap: 12 (ref 5–15)
BUN: 24 mg/dL — ABNORMAL HIGH (ref 6–20)
CALCIUM: 9.2 mg/dL (ref 8.9–10.3)
CHLORIDE: 105 mmol/L (ref 101–111)
CO2: 19 mmol/L — ABNORMAL LOW (ref 22–32)
CREATININE: 1.1 mg/dL — AB (ref 0.44–1.00)
GFR calc Af Amer: 59 mL/min — ABNORMAL LOW (ref >60)
GFR calc non Af Amer: 51 mL/min — ABNORMAL LOW (ref >60)
Glucose, Bld: 150 mg/dL — ABNORMAL HIGH (ref 65–99)
POTASSIUM: 4 mmol/L (ref 3.5–5.1)
Sodium: 136 mmol/L (ref 135–145)

## 2017-02-15 LAB — I-STAT TROPONIN, ED
TROPONIN I, POC: 0.03 ng/mL (ref 0.00–0.08)
TROPONIN I, POC: 0.02 ng/mL (ref 0.00–0.08)

## 2017-02-15 MED ORDER — HEPARIN SODIUM (PORCINE) 5000 UNIT/ML IJ SOLN
5000.0000 [IU] | Freq: Three times a day (TID) | INTRAMUSCULAR | Status: DC
  Administered 2017-02-15 – 2017-02-17 (×5): 5000 [IU] via SUBCUTANEOUS
  Filled 2017-02-15 (×5): qty 1

## 2017-02-15 MED ORDER — SODIUM CHLORIDE 0.9% FLUSH: 3.0000 mL | INTRAVENOUS | Status: DC | PRN

## 2017-02-15 MED ORDER — CARVEDILOL 3.125 MG PO TABS
3.1250 mg | ORAL_TABLET | Freq: Two times a day (BID) | ORAL | Status: DC
  Administered 2017-02-16 – 2017-02-17 (×3): 3.125 mg via ORAL
  Filled 2017-02-15 (×3): qty 1

## 2017-02-15 MED ORDER — ACETAMINOPHEN 325 MG PO TABS
650.0000 mg | ORAL_TABLET | ORAL | Status: DC | PRN
Start: 2017-02-15 — End: 2017-02-17

## 2017-02-15 MED ORDER — ONDANSETRON HCL 4 MG/2ML IJ SOLN: 4.0000 mg | Freq: Four times a day (QID) | INTRAMUSCULAR | Status: DC | PRN

## 2017-02-15 MED ORDER — ASPIRIN 81 MG PO CHEW
324.0000 mg | CHEWABLE_TABLET | Freq: Once | ORAL | Status: AC
  Administered 2017-02-15: 324 mg via ORAL
  Filled 2017-02-15: qty 4

## 2017-02-15 MED ORDER — IOPAMIDOL (ISOVUE-370) INJECTION 76%
INTRAVENOUS | Status: AC
  Administered 2017-02-15: 100 mL
  Filled 2017-02-15: qty 100

## 2017-02-15 MED ORDER — LOSARTAN POTASSIUM 50 MG PO TABS
50.0000 mg | ORAL_TABLET | Freq: Every day | ORAL | Status: DC
  Administered 2017-02-16 – 2017-02-17 (×2): 50 mg via ORAL
  Filled 2017-02-15 (×2): qty 1

## 2017-02-15 MED ORDER — SODIUM CHLORIDE 0.9% FLUSH
3.0000 mL | Freq: Two times a day (BID) | INTRAVENOUS | Status: DC
  Administered 2017-02-15 – 2017-02-17 (×3): 3 mL via INTRAVENOUS

## 2017-02-15 MED ORDER — ASPIRIN EC 81 MG PO TBEC
81.0000 mg | DELAYED_RELEASE_TABLET | Freq: Every day | ORAL | Status: DC
  Administered 2017-02-16 – 2017-02-17 (×2): 81 mg via ORAL
  Filled 2017-02-15 (×2): qty 1

## 2017-02-15 MED ORDER — FUROSEMIDE 10 MG/ML IJ SOLN
60.0000 mg | Freq: Once | INTRAMUSCULAR | Status: AC
  Administered 2017-02-15: 60 mg via INTRAVENOUS
  Filled 2017-02-15: qty 6

## 2017-02-15 MED ORDER — SODIUM CHLORIDE 0.9 % IV SOLN: 250.0000 mL | INTRAVENOUS | Status: DC | PRN

## 2017-02-15 MED ORDER — METOPROLOL TARTRATE 5 MG/5ML IV SOLN
5.0000 mg | Freq: Once | INTRAVENOUS | Status: AC
  Administered 2017-02-15: 5 mg via INTRAVENOUS
  Filled 2017-02-15: qty 5

## 2017-02-15 NOTE — H&P (Signed)
Cardiology Admission History and Physical:   Patient ID: Denise Anderson; MRN: [id number]; DOB: 15-Jul-1950   Admission date: 02/15/2017  Primary Care Provider: Shirlean Mylar, MD Primary Cardiologist: New    Chief Complaint:  Chest pain and DOE  Patient Profile:   Denise Anderson is a 66 y.o. female with a history of HTN and family h/o heart disease who presents to the ED with complaint of chest pain x 3 days and exertional dyspnea x 3 weeks.   History of Present Illness:   As outlined above, pt has h/o HTN. She reports that she was on atenolol several years ago but stopped taking this medication about 2 years ago after her blood pressure was doing better. She reports that her blood pressure has been stable/diet-controlled.  Also family h/o heart disease. She notes her mother had an MI but she is unsure of her age when MI occurred. Her mother was also told that she had a leaky heart valve but never required surgery. Her mother died at age 30. She has no other risk factors. She denies any h/o tobacco use. No h/o DM or HLD.   She has had chest pain x 3 days and exertional dyspnea x 3 weeks. Her CP seems more atypical and perhaps GI related. Associated with meals. It feels like her food gets stuck after swallowing. Also feels like she is going to regurgitate liquids after ingestion. Pills are also difficult to get down. She denies any exertional component with her CP.   Her dyspnea occurs with exertion, particularly walking up stairs. By the time she reaches the top of the stairs, she is short of breath. She denies resting dyspnea. Mild orthopnea, no PND or LEE.    She was first seen by her PCP last Friday who diagnosed her with acute bronchitis and prescribed a course of antibiotics and steroids (prednisone), however no improvement. She notes a h/o recent travel to [place] by car (13 hr car ride). Chest CT is pending to r/o PE. EKG shows sinus tach 124 bpm with LVF/ Twave abnormalities. No prior EKGs for  comparison. CBC unremarkable. BMP shows mild renal insufficiency with SCr at 1.10 and BUN at 19. Glucose is 150. BP is elevated in ED at 162/122. CXR with borderline to mild cardiomegaly.  Negative for edema or infiltrate. BNP had been collected. Results pending. IV Lasix and IV metoprolol ordered.    Past Medical History:  Diagnosis Date  . Hypertension     History reviewed. No pertinent surgical history.   Medications Prior to Admission: Prior to Admission medications   Medication Sig Start Date End Date Taking? Authorizing Provider  azithromycin (ZITHROMAX) 250 MG tablet Take 250 mg by mouth See admin instructions. Started 02/12/2017 Day 1:Take 500 mg by mount day 1 Day 2:Take 250 mg by mouth once a day through day 5   Yes [provider]  Garlic 2000 MG CAPS Take 4,000 mg by mouth daily.   Yes [provider]  OVER THE COUNTER MEDICATION Take 1 capsule by mouth 2 (two) times daily. Moringa Capsule   Yes [provider]  predniSONE (DELTASONE) 20 MG tablet Take 40 mg by mouth daily.    Yes [provider]     Allergies:   No Known Allergies  Social History:   Social History   Social History  . Marital status: Married    Spouse name: Denise Anderson  . Number of children: Denise Anderson  . Years of education: Denise Anderson   Occupational History  .  Not on file.   Social History Main Topics  . Smoking status: Never Smoker  . Smokeless tobacco: Never Used  . Alcohol use No  . Drug use: No  . Sexual activity: Not on file   Other Topics Concern  . Not on file   Social History Narrative  . No narrative on file    Family History:   The patient's family history includes CAD in her mother; Valvular heart disease in her mother.    ROS:  Please see the history of present illness.  All other ROS reviewed and negative.     Physical Exam/Data:   Vitals:   02/15/17 1542 02/15/17 1545 02/15/17 1600 02/15/17 1645  BP: (!) 166/121 (!) 159/117 (!) 161/122 (!) 162/122    Pulse: 63 60 (!) 117 (!) 119  Resp: 20 19 17 19   Temp:      TempSrc:      SpO2: 98% 99% 98% 97%  Weight:      Height:       No intake or output data in the 24 hours ending 02/15/17 1842 Filed Weights   02/15/17 1436  Weight: 190 lb (86.2 kg)   Body mass index is 32.11 kg/m.  General:  Well nourished, well developed, in no acute distress HEENT: normal Lymph: no adenopathy Neck: no JVD Endocrine:  No thryomegaly Vascular: No carotid bruits; FA pulses 2+ bilaterally without bruits  Cardiac:  normal S1, S2; RR w/ tachy rate; with gallop Lungs:  clear to auscultation bilaterally, no wheezing, rhonchi or rales  Abd: soft, nontender, no hepatomegaly  Ext: no edema Musculoskeletal:  No deformities, BUE and BLE strength normal and equal Skin: warm and dry  Neuro:  CNs 2-12 intact, no focal abnormalities noted Psych:  Normal affect    EKG:  The ECG that was done was personally reviewed and demonstrates sinus tachycardia w/ LVH 127 bpm. No baseline for comparison   Relevant CV Studies: Pending   Laboratory Data:  Chemistry  Recent Labs Lab 02/15/17 1455  NA 136  K 4.0  CL 105  CO2 19*  GLUCOSE 150*  BUN 24*  CREATININE 1.10*  CALCIUM 9.2  GFRNONAA 51*  GFRAA 59*  ANIONGAP 12    No results for input(s): PROT, ALBUMIN, AST, ALT, ALKPHOS, BILITOT in the last 168 hours. Hematology  Recent Labs Lab 02/15/17 1455  WBC 5.4  RBC 4.20  HGB 12.6  HCT 37.8  MCV 90.0  MCH 30.0  MCHC 33.3  RDW 15.0  PLT 222   Cardiac EnzymesNo results for input(s): TROPONINI in the last 168 hours.   Recent Labs Lab 02/15/17 1501 02/15/17 1815  TROPIPOC 0.03 0.02    BNPNo results for input(s): BNP, PROBNP in the last 168 hours.  DDimer No results for input(s): DDIMER in the last 168 hours.  Radiology/Studies:  Dg Chest 2 View  Result Date: 02/15/2017 CLINICAL DATA:  Chest pain with nausea EXAM: CHEST  2 VIEW COMPARISON:  None. FINDINGS: No acute infiltrate or effusion.  Borderline to mild cardiomegaly. Negative for pneumothorax. IMPRESSION: Borderline to mild cardiomegaly.  Negative for edema or infiltrate. Electronically Signed   By: Jasmine Pang M.D.   On: 02/15/2017 15:26    Assessment and Plan:   1. Exertional Dyspnea: CXR shows cardiomegaly but negative for edema and infiltrate. BNP is pending. Given sinus tach + recent prolonged travel by car to [place], we will need to exclude PE. Chest CT is pending. If BNP is elevated, recommend IV Lasix.  We will also obtain a 2D echo to check LVF and valve anatomy. May consider stress testing to exclude coronary ischemia.   2. Chest Pain: typical and features that seem more c/w GI etiology. Associated with meals. It feels like her food gets stuck after swallowing. Also feels like she is going to regurgitate liquids after ingestion. Pills are also difficult to get down. She denies any exertional component with her CP. POC troponin is negative.  We will monitor. 2D echo will be ordered to r/o structural heart disease and check LVEF. She is currently CP free. If recurrent pain, can try GI cocktail and addition of PPI. We will decide on need for NST prior to discharge.   3. HTN: poorly controlled. Pt was once on atenolol but stopped taking med ~2 years ago and started to control BP through diet. HR is also elevated. We will need to restart on a BB and monitor. She may require an additional agent. Pt is AA. May consider addition of a thiazide diuretic or CCB.   4. Additional Risk Factor Screening: we will check a FLP in the AM to r/o HLD and Hgb A1c to screen for DM.   5. 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    Signed, Audrinna Hoffmeier, PA-C  02/15/2017 6:42 PM   I have seen and examined the patient along with Robbie Lis, PA-C.  I have reviewed the chart, notes and new data.  I agree with PA's note.  Key new complaints: She describes exertional dyspnea followed by frank orthopnea. No real edema. Has not experienced angina on  exertion. Denies palpitations or syncope. Key examination changes: She does not have edema or any appreciable jugular venous distention, but has resting tachycardia and a distinct cardiac gallop. No murmurs appreciated. Apical impulse is laterally displaced. Key new findings / data: Cardiomegaly with prominent left ventricular shadow on chest x-ray, LVH with prominent repolarization abnormalities on ECG, elevated BNP, borderline creatinine.  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 Clinical exam data point to likely systolic dysfunction, possibly severely depressed left ventricular ejection fraction. Primary culprit appears to be untreated systemic hypertension, notwithstanding reports of well-controlled blood pressure at home. Echocardiography will be the initial test and the results will make a big difference on the subsequent direction of her evaluation. Start very low-dose beta blocker as well as RAAS inhibitor and diuretics. It is also likely she will require a direct vasodilator such as BiDil or amlodipine for blood pressure control. I agent will depend heavily on what the LVEF is. Took the opportunity to briefly discuss dietary sodium restriction, daily weight monitoring, signs and symptoms of congestive heart failure exacerbation. These will have to be reviewed in detail during the hospitalization.  Thurmon Fair, MD, [facility] [phone] 02/15/2017, 6:49 PM

## 2017-02-15 NOTE — ED Notes (Signed)
Attempted report 

## 2017-02-15 NOTE — ED Notes (Signed)
Pt to CT

## 2017-02-15 NOTE — ED Notes (Signed)
Pt ambulatory to bathroom. NAD. VSS. No worsening of shortness of breath noted.

## 2017-02-15 NOTE — ED Triage Notes (Signed)
Pt states she has been SOB for several weeks. Then went on a long road trip and SOB increased. Pt has had CP for approx 5 days. Today, CP increased and pt became diaphoretic. Denies CP in triage at this moment.

## 2017-02-15 NOTE — ED Provider Notes (Signed)
[facility]    CSN: [id number] Arrival date & time: 02/15/17  1257     History   Chief Complaint Chief Complaint  Patient presents with  . Chest Pain    HPI Denise Anderson is a 66 y.o. female.   66 year old female with history of hypertension comes in for a few week history of dyspnea on exertion, 3 day history of chest pain. Patient states she was seen 3 days ago by her PCP, who diagnosed her with bronchitis, and was given azithromycin and prednisone for treatment. She continues to have shortness of breath, and substernal chest pain. She states pain comes and goes, and causes nausea. She recently traveled by car to [place], going through 14 hour car rides, denies history of smoking, no current history of birth control use. She does have hypertension, diet is not treated by medication. She has family history of heart disease, mother with history of heart attacks and valve disorder, patient isn't sure age of heart attack, but mother passed away at age of 73.      Past Medical History:  Diagnosis Date  . Acute CHF (congestive heart failure) (HCC) 02/15/2017  . Hypertension     Patient Active Problem List   Diagnosis Date Noted  . Acute CHF (congestive heart failure) (HCC) 02/15/2017  . Leukocytopenia 07/03/2013    History reviewed. No pertinent surgical history.  OB History    No data available       Home Medications    Prior to Admission medications   Medication Sig Start Date End Date Taking? Authorizing Provider  azithromycin (ZITHROMAX) 250 MG tablet Take 250 mg by mouth See admin instructions. Started 02/12/2017 Day 1:Take 500 mg by mount day 1 Day 2:Take 250 mg by mouth once a day through day 5   Yes [provider]  predniSONE (DELTASONE) 20 MG tablet Take 40 mg by mouth daily.    Yes [provider]  Garlic 2000 MG CAPS Take 4,000 mg by mouth daily.    [provider]  OVER THE COUNTER MEDICATION Take 1 capsule by mouth 2 (two)  times daily. Moringa Capsule    [provider]    Family History Family History  Problem Relation Age of Onset  . CAD Mother   . Valvular heart disease Mother     Social History Social History  Substance Use Topics  . Smoking status: Never Smoker  . Smokeless tobacco: Never Used  . Alcohol use No     Allergies   Patient has no known allergies.   Review of Systems Review of Systems  Reason unable to perform ROS: See HPI as above.     Physical Exam Triage Vital Signs ED Triage Vitals [02/15/17 1340]  Enc Vitals Group     BP (!) 152/118     Pulse Rate 68     Resp 20     Temp 98.6 F (37 C)     Temp src      SpO2 100 %     Weight      Height      Head Circumference      Peak Flow      Pain Score 5     Pain Loc      Pain Edu?      Excl. in GC?    No data found.   Updated Vital Signs BP (!) 152/118 (BP Location: Right Arm)   Pulse 68   Temp 98.6 F (37 C)  Resp 20   SpO2 100%    Physical Exam  Constitutional: She is oriented to person, place, and time. She appears well-developed and well-nourished. No distress.  HENT:  Head: Normocephalic and atraumatic.  Eyes: Pupils are equal, round, and reactive to light. Conjunctivae are normal.  Cardiovascular: Regular rhythm and normal heart sounds.  Tachycardia present.   Pulmonary/Chest: Effort normal.  Bilateral crackles on lower bases.   Musculoskeletal:  Mild pitting edema on palpation of ankles, otherwise no significant swelling noted.   Neurological: She is alert and oriented to person, place, and time.  Skin: Skin is warm and dry.     UC Treatments / Results  Labs (all labs ordered are listed, but only abnormal results are displayed) Labs Reviewed - No data to display  EKG: sinus tachycardia, 123 bpm, possible LAD, T wave inversion of Lead I, II, V4, V5, V6  EKG Interpretation None       Radiology Dg Chest 2 View  Result Date: 02/15/2017 CLINICAL DATA:  Chest pain with nausea  EXAM: CHEST  2 VIEW COMPARISON:  None. FINDINGS: No acute infiltrate or effusion. Borderline to mild cardiomegaly. Negative for pneumothorax. IMPRESSION: Borderline to mild cardiomegaly.  Negative for edema or infiltrate. Electronically Signed   By: Jasmine Pang M.D.   On: 02/15/2017 15:26   Ct Angio Chest Pe W And/or Wo Contrast  Result Date: 02/15/2017 CLINICAL DATA:  Chest pain for the past 2 weeks, increased in severity today. EXAM: CT ANGIOGRAPHY CHEST, ABDOMEN AND PELVIS TECHNIQUE: Multidetector CT imaging through the chest, abdomen and pelvis was performed using the standard protocol during bolus administration of intravenous contrast. Multiplanar reconstructed images and MIPs were obtained and reviewed to evaluate the vascular anatomy. CONTRAST:  80 cc Isovue 370 COMPARISON:  Chest radiographs obtained today. FINDINGS: CTA CHEST FINDINGS Cardiovascular: Normally opacified pulmonary arteries with no pulmonary arterial filling defects seen. Refluxed contrast into the azygos vein. Four-chamber enlargement of the heart. No pericardial fluid. Mediastinum/Nodes: No enlarged mediastinal, hilar, or axillary lymph nodes. Thyroid gland, trachea, and esophagus demonstrate no significant findings. Lungs/Pleura: Small to moderate-sized bilateral pleural effusions. Mild heterogeneous accentuation of the interstitial markings in both lungs. Minimal patchy airspace opacity in the right lower lobe. Prominent pulmonary vasculature in the lungs. Upper abdomen: Refluxed contrast in the inferior vena cava and hepatic veins. Small calcified splenic granuloma. Colonic diverticulosis. Musculoskeletal: Thoracic spine degenerative changes. Review of the MIP images confirms the above findings. IMPRESSION: 1. No pulmonary emboli. 2. Cardiomegaly and changes of acute congestive heart failure. 3. Small amount of alveolar edema or pneumonia in the right lower lobe. Electronically Signed   By: Beckie Salts M.D.   On: 02/15/2017 19:27      Procedures Procedures (including critical care time)  Medications Ordered in UC Medications - No data to display   Initial Impression / Assessment and Plan / UC Course  I have reviewed the triage vital signs and the nursing notes.  Pertinent labs & imaging results that were available during my care of the patient were reviewed by me and considered in my medical decision making (see chart for details).    Discussed with patient given history and exam, cannot rule out pulmonary embolism, cardiac disease. Patient currently in stable condition, discharged to ED for further evaluation.  Final Clinical Impressions(s) / UC Diagnoses   Final diagnoses:  Atypical chest pain  Shortness of breath    New Prescriptions Discharge Medication List as of 02/15/2017  2:14 PM  Belinda Fisher, PA-C 02/15/17 2056

## 2017-02-15 NOTE — ED Triage Notes (Signed)
Shortness  Of  Breath   X   sev  Weeks   Chest  Pain  X  3  Days          ot  Reports  Earlier  Today   Broke  Out  In a  Cold   Sweat    Pain  Scale   Now   Is  A  5     History  Of  Heart  Disease    In  Family

## 2017-02-15 NOTE — Discharge Instructions (Signed)
Go to the ED for further evaluation and treatment of chest pain, shortness of breath, tachycardia

## 2017-02-15 NOTE — ED Provider Notes (Signed)
[facility]-EMERGENCY DEPT Provider Note   CSN: [id number] Arrival date & time: 02/15/17  1432     History   Chief Complaint Chief Complaint  Patient presents with  . Chest Pain  . Shortness of Breath    HPI Denise Anderson is a 66 y.o. female.  HPI  66 year old female history of hypertension presents today complaining of chest pain and dyspnea. She states that she was in her usual state of health this morning when she began having some pain that was in the substernal area and radiated through to the back and was severe. The pain lasted approximately one hour. [place] artery disease. For tension but has not been taking her medications for the past several years. She states that she was seen by her primary care physician in the office on Friday and treated with prednisone and a Z-Pak. States her blood pressure was elevated at 160. Eyes any previous cardiac evaluation. She is currently pain-free. He has orthopnea and exertional dyspnea   Past Medical History:  Diagnosis Date  . Hypertension     Patient Active Problem List   Diagnosis Date Noted  . Leukocytopenia 07/03/2013    History reviewed. No pertinent surgical history.  OB History    No data available       Home Medications    Prior to Admission medications   Medication Sig Start Date End Date Taking? Authorizing Provider  azithromycin (ZITHROMAX) 250 MG tablet Take 250 mg by mouth See admin instructions. Started 02/12/2017 Day 1:Take 500 mg by mount day 1 Day 2:Take 250 mg by mouth once a day through day 5   Yes [provider]  Garlic 2000 MG CAPS Take 4,000 mg by mouth daily.   Yes [provider]  OVER THE COUNTER MEDICATION Take 1 capsule by mouth 2 (two) times daily. [place] Capsule   Yes [provider]  predniSONE (DELTASONE) 20 MG tablet Take 40 mg by mouth daily.    Yes [provider]    Family History Family History  Problem Relation Age of Onset  . CAD Mother   . Valvular  heart disease Mother     Social History Social History  Substance Use Topics  . Smoking status: Never Smoker  . Smokeless tobacco: Never Used  . Alcohol use No     Allergies   Patient has no known allergies.   Review of Systems Review of Systems  All other systems reviewed and are negative.    Physical Exam Updated Vital Signs BP (!) 148/111   Pulse (!) 122   Temp 98.1 F (36.7 C) (Oral)   Resp 17   Ht 1.638 m (5' 4.5")   Wt 86.2 kg (190 lb)   SpO2 99%   BMI 32.11 kg/m   Physical Exam  Constitutional: She appears well-developed and well-nourished.  HENT:  Head: Normocephalic and atraumatic.  Right Ear: External ear normal.  Left Ear: External ear normal.  Nose: Nose normal.  Eyes: Pupils are equal, round, and reactive to light. Conjunctivae and EOM are normal.  Neck: Normal range of motion. Neck supple.  Cardiovascular: Normal rate, regular rhythm, normal heart sounds and intact distal pulses.   Pulmonary/Chest: Effort normal and breath sounds normal.  Abdominal: Soft. Bowel sounds are normal.  Musculoskeletal: Normal range of motion.  Neurological: She is alert.  Skin: Skin is warm and dry. Capillary refill takes less than 2 seconds.  Psychiatric: She has a normal mood and affect. Her behavior is normal.  Nursing  note and vitals reviewed.    ED Treatments / Results  Labs (all labs ordered are listed, but only abnormal results are displayed) Labs Reviewed  BASIC METABOLIC PANEL - Abnormal; Notable for the following:       Result Value   CO2 19 (*)    Glucose, Bld 150 (*)    BUN 24 (*)    Creatinine, Ser 1.10 (*)    GFR calc non Af Amer 51 (*)    GFR calc Af Amer 59 (*)    All other components within normal limits  CBC  BRAIN NATRIURETIC PEPTIDE  I-STAT TROPONIN, ED  I-STAT TROPONIN, ED    EKG  EKG Interpretation  Date/Time:  Monday February 15 2017 14:36:54 EDT Ventricular Rate:  127 PR Interval:  120 QRS Duration: 74 QT  Interval:  326 QTC Calculation: 473 R Axis:   67 Text Interpretation:  Sinus tachycardia with Premature atrial complexes Possible Left atrial enlargement Left ventricular hypertrophy ST & T wave abnormality, consider inferolateral ischemia Abnormal ECG Confirmed by Margarita Grizzle [phone]) on 02/15/2017 4:58:33 PM       Radiology Dg Chest 2 View  Result Date: 02/15/2017 CLINICAL DATA:  Chest pain with nausea EXAM: CHEST  2 VIEW COMPARISON:  None. FINDINGS: No acute infiltrate or effusion. Borderline to mild cardiomegaly. Negative for pneumothorax. IMPRESSION: Borderline to mild cardiomegaly.  Negative for edema or infiltrate. Electronically Signed   By: Jasmine Pang M.D.   On: 02/15/2017 15:26   Ct Angio Chest Pe W And/or Wo Contrast  Result Date: 02/15/2017 CLINICAL DATA:  Chest pain for the past 2 weeks, increased in severity today. EXAM: CT ANGIOGRAPHY CHEST, ABDOMEN AND PELVIS TECHNIQUE: Multidetector CT imaging through the chest, abdomen and pelvis was performed using the standard protocol during bolus administration of intravenous contrast. Multiplanar reconstructed images and MIPs were obtained and reviewed to evaluate the vascular anatomy. CONTRAST:  80 cc Isovue 370 COMPARISON:  Chest radiographs obtained today. FINDINGS: CTA CHEST FINDINGS Cardiovascular: Normally opacified pulmonary arteries with no pulmonary arterial filling defects seen. Refluxed contrast into the azygos vein. Four-chamber enlargement of the heart. No pericardial fluid. Mediastinum/Nodes: No enlarged mediastinal, hilar, or axillary lymph nodes. Thyroid gland, trachea, and esophagus demonstrate no significant findings. Lungs/Pleura: Small to moderate-sized bilateral pleural effusions. Mild heterogeneous accentuation of the interstitial markings in both lungs. Minimal patchy airspace opacity in the right lower lobe. Prominent pulmonary vasculature in the lungs. Upper abdomen: Refluxed contrast in the inferior vena cava and  hepatic veins. Small calcified splenic granuloma. Colonic diverticulosis. Musculoskeletal: Thoracic spine degenerative changes. Review of the MIP images confirms the above findings. IMPRESSION: 1. No pulmonary emboli. 2. Cardiomegaly and changes of acute congestive heart failure. 3. Small amount of alveolar edema or pneumonia in the right lower lobe. Electronically Signed   By: Beckie Salts M.D.   On: 02/15/2017 19:27    Procedures Procedures (including critical care time)  Medications Ordered in ED Medications  aspirin chewable tablet 324 mg (324 mg Oral Given 02/15/17 1836)  furosemide (LASIX) injection 60 mg (60 mg Intravenous Given 02/15/17 1837)  metoprolol tartrate (LOPRESSOR) injection 5 mg (5 mg Intravenous Given 02/15/17 1837)  iopamidol (ISOVUE-370) 76 % injection (100 mLs  Contrast Given 02/15/17 1906)     Initial Impression / Assessment and Plan / ED Course  I have reviewed the triage vital signs and the nursing notes.  Pertinent labs & imaging results that were available during my care of the patient were reviewed by me  and considered in my medical decision making (see chart for details).    1 chest pain pain is concerning for cardiac ischemia and patient had abnormal EKG. CT anterior obtained to evaluate for pulmonary embolism as well as to evaluate great vessels given that the pain is radiating through the back. There does not appear to be any dissection or PE. Initial troponin is normal. She is currently pain-free. She's been given aspirin 2 hypertension she has not been taking her antihypertensive. She is given Lopressor here in the emergency department  Cardiology consult obtained and they will admit.  Final Clinical Impressions(s) / ED Diagnoses   Final diagnoses:  Chest pain, unspecified type  Abnormal EKG  Congestive heart failure, unspecified HF chronicity, unspecified heart failure type (HCC)  Hypertension, unspecified type    New Prescriptions New Prescriptions    No medications on file     Margarita Grizzle, MD 02/15/17 [phone]

## 2017-02-16 ENCOUNTER — Other Ambulatory Visit (HOSPITAL_COMMUNITY): Payer: Medicare Other

## 2017-02-16 ENCOUNTER — Inpatient Hospital Stay (HOSPITAL_COMMUNITY): Payer: Medicare Other

## 2017-02-16 DIAGNOSIS — I5021 Acute systolic (congestive) heart failure: Secondary | ICD-10-CM

## 2017-02-16 DIAGNOSIS — I361 Nonrheumatic tricuspid (valve) insufficiency: Secondary | ICD-10-CM

## 2017-02-16 LAB — BASIC METABOLIC PANEL
Anion gap: 12 (ref 5–15)
BUN: 24 mg/dL — AB (ref 6–20)
CHLORIDE: 103 mmol/L (ref 101–111)
CO2: 24 mmol/L (ref 22–32)
CREATININE: 1.18 mg/dL — AB (ref 0.44–1.00)
Calcium: 9.1 mg/dL (ref 8.9–10.3)
GFR calc Af Amer: 54 mL/min — ABNORMAL LOW (ref >60)
GFR calc non Af Amer: 47 mL/min — ABNORMAL LOW (ref >60)
GLUCOSE: 92 mg/dL (ref 65–99)
POTASSIUM: 3.3 mmol/L — AB (ref 3.5–5.1)
Sodium: 139 mmol/L (ref 135–145)

## 2017-02-16 LAB — T4, FREE: FREE T4: 0.98 ng/dL (ref 0.61–1.12)

## 2017-02-16 LAB — ECHOCARDIOGRAM COMPLETE
HEIGHTINCHES: 64.5 in
Weight: 2915.2 oz

## 2017-02-16 LAB — HEMOGLOBIN A1C
HEMOGLOBIN A1C: 5.8 % — AB (ref 4.8–5.6)
MEAN PLASMA GLUCOSE: 119.76 mg/dL

## 2017-02-16 LAB — TROPONIN I
Troponin I: 0.05 ng/mL (ref <0.03)
Troponin I: 0.05 ng/mL (ref <0.03)
Troponin I: 0.04 ng/mL (ref <0.03)

## 2017-02-16 LAB — BRAIN NATRIURETIC PEPTIDE: B Natriuretic Peptide: 2748.8 pg/mL — ABNORMAL HIGH (ref 0.0–100.0)

## 2017-02-16 LAB — TSH: TSH: 0.453 u[IU]/mL (ref 0.350–4.500)

## 2017-02-16 MED ORDER — ALUM & MAG HYDROXIDE-SIMETH 200-200-20 MG/5ML PO SUSP
30.0000 mL | Freq: Once | ORAL | Status: AC
  Administered 2017-02-16: 30 mL via ORAL
  Filled 2017-02-16: qty 30

## 2017-02-16 MED ORDER — ALUM & MAG HYDROXIDE-SIMETH 200-200-20 MG/5ML PO SUSP
30.0000 mL | ORAL | Status: DC | PRN
  Administered 2017-02-16: 30 mL via ORAL
  Filled 2017-02-16 (×2): qty 30

## 2017-02-16 NOTE — Progress Notes (Signed)
  Echocardiogram 2D Echocardiogram has been performed.  Leta Jungling M 02/16/2017, 10:56 AM

## 2017-02-16 NOTE — Progress Notes (Signed)
Progress Note  Patient Name: Denise Anderson Date of Encounter: 02/16/2017  Primary Cardiologist: New to Dr. Royann Shivers   Subjective   No chest pain pain or dyspnea. Almost 4L output. Patient complain of epigastric pain with food and water. No swallowing issue.   Inpatient Medications    Scheduled Meds: . alum & mag hydroxide-simeth  30 mL Oral Once  . aspirin EC  81 mg Oral Daily  . carvedilol  3.125 mg Oral BID WC  . heparin  5,000 Units Subcutaneous Q8H  . losartan  50 mg Oral Daily  . sodium chloride flush  3 mL Intravenous Q12H   Continuous Infusions: . sodium chloride     PRN Meds: sodium chloride, acetaminophen, ondansetron (ZOFRAN) IV, sodium chloride flush   Vital Signs    Vitals:   02/15/17 2200 02/15/17 2321 02/16/17 0541 02/16/17 0611  BP: (!) 139/115 (!) 155/108    Pulse: (!) 107 (!) 115    Resp: 20 20    Temp:  97.8 F (36.6 C) 97.7 F (36.5 C)   TempSrc:  Oral Oral   SpO2: 96% 100% 98%   Weight:    182 lb 3.2 oz (82.6 kg)  Height:        Intake/Output Summary (Last 24 hours) at 02/16/17 1115 Last data filed at 02/16/17 0930  Gross per 24 hour  Intake              240 ml  Output             4925 ml  Net            -4685 ml   Filed Weights   02/15/17 1436 02/16/17 0611  Weight: 190 lb (86.2 kg) 182 lb 3.2 oz (82.6 kg)    Telemetry    Sinus tachycardia at rate of 90-100s. PVCs - Personally Reviewed  ECG    None today   Physical Exam   GEN: No acute distress.   Neck: No JVD Cardiac: RRR, no murmurs, rubs, or gallops.  Respiratory: Clear to auscultation bilaterally. GI: Soft, nontender, non-distended  MS: No edema; No deformity. Neuro:  Nonfocal  Psych: Normal affect   Labs    Chemistry Recent Labs Lab 02/15/17 1455 02/16/17 0658  NA 136 139  K 4.0 3.3*  CL 105 103  CO2 19* 24  GLUCOSE 150* 92  BUN 24* 24*  CREATININE 1.10* 1.18*  CALCIUM 9.2 9.1  GFRNONAA 51* 47*  GFRAA 59* 54*  ANIONGAP 12 12     Hematology Recent  Labs Lab 02/15/17 1455  WBC 5.4  RBC 4.20  HGB 12.6  HCT 37.8  MCV 90.0  MCH 30.0  MCHC 33.3  RDW 15.0  PLT 222    Cardiac Enzymes Recent Labs Lab 02/15/17 2326 02/16/17 0658  TROPONINI 0.05* 0.05*    Recent Labs Lab 02/15/17 1501 02/15/17 1815  TROPIPOC 0.03 0.02     BNP Recent Labs Lab 02/15/17 2326  BNP 2,748.8*     DDimer No results for input(s): DDIMER in the last 168 hours.   Radiology    Dg Chest 2 View  Result Date: 02/15/2017 CLINICAL DATA:  Chest pain with nausea EXAM: CHEST  2 VIEW COMPARISON:  None. FINDINGS: No acute infiltrate or effusion. Borderline to mild cardiomegaly. Negative for pneumothorax. IMPRESSION: Borderline to mild cardiomegaly.  Negative for edema or infiltrate. Electronically Signed   By: Jasmine Pang M.D.   On: 02/15/2017 15:26   Ct Angio Chest Pe W  And/or Wo Contrast  Result Date: 02/15/2017 CLINICAL DATA:  Chest pain for the past 2 weeks, increased in severity today. EXAM: CT ANGIOGRAPHY CHEST, ABDOMEN AND PELVIS TECHNIQUE: Multidetector CT imaging through the chest, abdomen and pelvis was performed using the standard protocol during bolus administration of intravenous contrast. Multiplanar reconstructed images and MIPs were obtained and reviewed to evaluate the vascular anatomy. CONTRAST:  80 cc Isovue 370 COMPARISON:  Chest radiographs obtained today. FINDINGS: CTA CHEST FINDINGS Cardiovascular: Normally opacified pulmonary arteries with no pulmonary arterial filling defects seen. Refluxed contrast into the azygos vein. Four-chamber enlargement of the heart. No pericardial fluid. Mediastinum/Nodes: No enlarged mediastinal, hilar, or axillary lymph nodes. Thyroid gland, trachea, and esophagus demonstrate no significant findings. Lungs/Pleura: Small to moderate-sized bilateral pleural effusions. Mild heterogeneous accentuation of the interstitial markings in both lungs. Minimal patchy airspace opacity in the right lower lobe. Prominent  pulmonary vasculature in the lungs. Upper abdomen: Refluxed contrast in the inferior vena cava and hepatic veins. Small calcified splenic granuloma. Colonic diverticulosis. Musculoskeletal: Thoracic spine degenerative changes. Review of the MIP images confirms the above findings. IMPRESSION: 1. No pulmonary emboli. 2. Cardiomegaly and changes of acute congestive heart failure. 3. Small amount of alveolar edema or pneumonia in the right lower lobe. Electronically Signed   By: Beckie Salts M.D.   On: 02/15/2017 19:27    Cardiac Studies   Echo done pending reading  Patient Profile     66 y.o. female with a history of HTN and family h/o heart disease who presents to the ED with complaint of chest pain x 3 days and exertional dyspnea x 3 weeks. Noted to have epigastric pain with food x several weeks.  Assessment & Plan    1. DOE -  CXR shows cardiomegaly but negative for edema and infiltrate. BNP 2748. CTA negative for PE and acute CHF.  - She is net -4.6L after one dose of IV lasix 60mg . Euvolemic on exam. Echo done. Pending reading. Continue coreg 3.125mg  BID and Losartan 50mg  qd.  - TSH normal. Check A1c and lipid panel.   2.  Epigastric pain - feels like her food gets stuck after swallowing. She will need GI evaluation and swallowing study. Inpatient vs outpatient. Will review with MD.   3. HTN - BP somewhat improved. Continue BB and ARB. Plan to add Bidil vs amlodipine pending echo as recommended by Dr. Royann Shivers.   Signed, Manson Passey, PA  02/16/2017, 11:15 AM    History and all data above reviewed.  Patient examined.  I agree with the findings as above.  The feels much better.  Much less SOB.  still probably not at baseline.  The patient exam reveals COR:RRR  ,  Lungs: Few basilar crackles.   ,  Abd: Positive bowel sounds, no rebound no guarding, Ext No edema  .  All available labs, radiology testing, previous records reviewed. Agree with documented assessment and plan. ACUTE  SYSTOLIC HF:  I reviewed the echo images personally and I discussed with Dr. Duke Salvia.  Severely reduced EF about 20% with global hypokinesis.  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   I spent a long time talking to the patient and her daughter about this.  We talked about meds and salt and fluid restriction.  For today I would like to continue IV diuresis and the meds as ordered.  BP is improved.   I will defer ischemia work up to outpatient (CTA vs [employer]).  TSH was low  normal.  Check T3/T4.  Difficulty swallowing.  Out patient GI evalFayrene Fearing Digby Groeneveld  12:30 PM  02/16/2017

## 2017-02-17 ENCOUNTER — Other Ambulatory Visit: Payer: Self-pay | Admitting: Cardiovascular Disease

## 2017-02-17 ENCOUNTER — Telehealth: Payer: Self-pay | Admitting: Physician Assistant

## 2017-02-17 DIAGNOSIS — R1310 Dysphagia, unspecified: Secondary | ICD-10-CM

## 2017-02-17 LAB — BASIC METABOLIC PANEL
ANION GAP: 8 (ref 5–15)
BUN: 26 mg/dL — ABNORMAL HIGH (ref 6–20)
CO2: 29 mmol/L (ref 22–32)
Calcium: 8.6 mg/dL — ABNORMAL LOW (ref 8.9–10.3)
Chloride: 102 mmol/L (ref 101–111)
Creatinine, Ser: 1.35 mg/dL — ABNORMAL HIGH (ref 0.44–1.00)
GFR calc Af Amer: 46 mL/min — ABNORMAL LOW (ref >60)
GFR, EST NON AFRICAN AMERICAN: 40 mL/min — AB (ref >60)
Glucose, Bld: 174 mg/dL — ABNORMAL HIGH (ref 65–99)
POTASSIUM: 3.4 mmol/L — AB (ref 3.5–5.1)
SODIUM: 139 mmol/L (ref 135–145)

## 2017-02-17 LAB — LIPID PANEL
CHOL/HDL RATIO: 3 ratio
CHOLESTEROL: 157 mg/dL (ref 0–200)
HDL: 53 mg/dL (ref >40)
LDL Cholesterol: 81 mg/dL (ref 0–99)
TRIGLYCERIDES: 114 mg/dL (ref <150)
VLDL: 23 mg/dL (ref 0–40)

## 2017-02-17 LAB — T3: T3 TOTAL: 53 ng/dL — AB (ref 71–180)

## 2017-02-17 MED ORDER — CARVEDILOL 6.25 MG PO TABS: 6.2500 mg | ORAL_TABLET | Freq: Two times a day (BID) | ORAL | 6 refills | Status: DC

## 2017-02-17 MED ORDER — LIVING BETTER WITH HEART FAILURE BOOK
Freq: Once | Status: AC
  Administered 2017-02-17: 10:00:00

## 2017-02-17 MED ORDER — LOSARTAN POTASSIUM 50 MG PO TABS
50.0000 mg | ORAL_TABLET | Freq: Every day | ORAL | 6 refills | Status: DC
Start: 2017-02-18 — End: 2017-03-17

## 2017-02-17 MED ORDER — ASPIRIN 81 MG PO TBEC: 81.0000 mg | DELAYED_RELEASE_TABLET | Freq: Every day | ORAL | 6 refills | Status: AC

## 2017-02-17 MED ORDER — CARVEDILOL 6.25 MG PO TABS: 6.2500 mg | ORAL_TABLET | Freq: Two times a day (BID) | ORAL | Status: DC

## 2017-02-17 MED ORDER — FUROSEMIDE 20 MG PO TABS: ORAL_TABLET | ORAL | 2 refills | Status: DC

## 2017-02-17 NOTE — Telephone Encounter (Signed)
error 

## 2017-02-17 NOTE — Progress Notes (Signed)
Heart Failure Navigator Consult Note  Presentation: Denise Anderson is a 66 y.o.femalewith a history of HTN and family h/o heart disease who presents to the ED with complaint of chest pain x 3 daysand exertional dyspnea x 3 weeks. Noted to have epigastric pain with food x several weeks.   Past Medical History:  Diagnosis Date  . Acute CHF (congestive heart failure) (HCC) 02/15/2017  . Hypertension     Social History   Social History  . Marital status: Divorced    Spouse name: N/A  . Number of children: N/A  . Years of education: N/A   Social History Main Topics  . Smoking status: Never Smoker  . Smokeless tobacco: Never Used  . Alcohol use No  . Drug use: No  . Sexual activity: Not Asked   Other Topics Concern  . None   Social History Narrative  . None    ECHO:Study Conclusions-02/16/17  - Left ventricle: The cavity size was mildly dilated. Systolic   function was severely reduced. The estimated ejection fraction   was in the range of 15% to 20%. Diffuse hypokinesis. Features are   consistent with a pseudonormal left ventricular filling pattern,   with concomitant abnormal relaxation and increased filling   pressure (grade 2 diastolic dysfunction). Doppler parameters are   consistent with high ventricular filling pressure. - Aortic valve: Transvalvular velocity was within the normal range.   There was no stenosis. There was no regurgitation. - Mitral valve: Transvalvular velocity was within the normal range.   There was no evidence for stenosis. There was mild regurgitation.   Valve area by continuity equation (using LVOT flow): 0.11 cm^2.   Effective regurgitant orifice (PISA): 0.11 cm^2. Regurgitant   volume (PISA): 16.6 ml. - Left atrium: The atrium was severely dilated. - Right ventricle: The cavity size was normal. Wall thickness was   normal. Systolic function was normal. - Tricuspid valve: There was mild regurgitation. - Pulmonary arteries: Systolic pressure  was within the normal   range. PA peak pressure: 33 mm Hg (S).  ------------------------------------------------------------------- Study data:  No prior study was available for comparison.  Study status:  Routine.  Procedure:  The patient reported no pain pre or post test. Transthoracic echocardiography. Image quality was adequate.  Study completion:  There were no complications. Transthoracic echocardiography.  M-mode, complete 2D, spectral Doppler, and color Doppler.  Birthdate:  Patient birthdate: 1950/11/18.  Age:  Patient is 66 yr old.  Sex:  Gender: female. BMI: 0.1 kg/m^2.  Blood pressure:     155/108  Patient status: Inpatient.  Study date:  Study date: 02/16/2017. Study time: 10:16 AM.  Location:  Bedside.   BNP    Component Value Date/Time   BNP 2,748.8 (H) 02/15/2017 2326    ProBNP No results found for: PROBNP   Education Assessment and Provision:  Detailed education and instructions provided on heart failure disease management including the following:  Signs and symptoms of Heart Failure When to call the physician Importance of daily weights Low sodium diet Fluid restriction Medication management Anticipated future follow-up appointments  Patient education given on each of the above topics.  Patient acknowledges understanding and acceptance of all instructions.  I spoke with patient and her daughter regarding her current hospitalization and new HF diagnosis.  She tells me that she has been receiving education through bedside nursing staff and is able to teach back most topics listed above.  She does have scale at home and I reviewed the importance of daily  weights/ when to contact the physician.  We also discussed a low sodium diet and high sodium foods to avoid.  She admits that she will have dietary changes to make at home.  She also tells me that she does not have Medicare Part D (prescription coverage) and is unsure how much her medications will be.  We  reviewed her discharge medication list --only 4 new noted -all generics.  She thinks that she will be able to afford these.  I advised to her to contact the physician's office with any issues regarding her medications.  She lives with her daughter in [place] and will follow with [facility].  Education Materials:  "Living Better With Heart Failure" Booklet, Daily Weight Tracker Tool   High Risk Criteria for Readmission and/or Poor Patient Outcomes:   EF <30%- yes 15-20% with grade 2 dias dys  2 or more admissions in 6 months- no  Difficult social situation- no  Demonstrates medication noncompliance- no denies    Barriers of Care:  New HF, Knowledge and compliance  Discharge Planning:   Patient plans to return to home with her daughter.

## 2017-02-17 NOTE — Progress Notes (Signed)
Progress Note  Patient Name: Denise Anderson Date of Encounter: 02/17/2017  Primary Cardiologist: Dr. Royann Shivers  Subjective   Feeling well. No chest pain, sob or palpitations. Difficulty swallowing.  Inpatient Medications    Scheduled Meds: . aspirin EC  81 mg Oral Daily  . carvedilol  3.125 mg Oral BID WC  . heparin  5,000 Units Subcutaneous Q8H  . losartan  50 mg Oral Daily  . sodium chloride flush  3 mL Intravenous Q12H   Continuous Infusions: . sodium chloride     PRN Meds: sodium chloride, acetaminophen, alum & mag hydroxide-simeth, ondansetron (ZOFRAN) IV, sodium chloride flush   Vital Signs    Vitals:   02/16/17 1450 02/16/17 2043 02/17/17 0023 02/17/17 0530  BP: (!) 134/97 (!) 114/91 (!) 122/99 100/70  Pulse:  98 (!) 102 94  Resp:  19 18 17   Temp: 98.2 F (36.8 C) 98.1 F (36.7 C) (!) 97.4 F (36.3 C) 97.6 F (36.4 C)  TempSrc: Oral     SpO2: 97% 100% 100% 100%  Weight:    183 lb 12.8 oz (83.4 kg)  Height:        Intake/Output Summary (Last 24 hours) at 02/17/17 0906 Last data filed at 02/17/17 0531  Gross per 24 hour  Intake              480 ml  Output              850 ml  Net             -370 ml   Filed Weights   02/15/17 1436 02/16/17 0611 02/17/17 0530  Weight: 190 lb (86.2 kg) 182 lb 3.2 oz (82.6 kg) 183 lb 12.8 oz (83.4 kg)    Telemetry    Sinus rhythm at rate of 100s. PVCs - Personally Reviewed  ECG    None today   Physical Exam   GEN: No acute distress.   Neck: No JVD Cardiac: RRR, no murmurs, rubs, or gallops.  Respiratory: Clear to auscultation bilaterally. GI: Soft, nontender, non-distended  MS: No edema; No deformity. Neuro:  Nonfocal  Psych: Normal affect   Labs    Chemistry Recent Labs Lab 02/15/17 1455 02/16/17 0658 02/17/17 0307  NA 136 139 139  K 4.0 3.3* 3.4*  CL 105 103 102  CO2 19* 24 29  GLUCOSE 150* 92 174*  BUN 24* 24* 26*  CREATININE 1.10* 1.18* 1.35*  CALCIUM 9.2 9.1 8.6*  GFRNONAA 51* 47* 40*    GFRAA 59* 54* 46*  ANIONGAP 12 12 8      Hematology Recent Labs Lab 02/15/17 1455  WBC 5.4  RBC 4.20  HGB 12.6  HCT 37.8  MCV 90.0  MCH 30.0  MCHC 33.3  RDW 15.0  PLT 222    Cardiac Enzymes Recent Labs Lab 02/15/17 2326 02/16/17 0658 02/16/17 1219  TROPONINI 0.05* 0.05* 0.04*    Recent Labs Lab 02/15/17 1501 02/15/17 1815  TROPIPOC 0.03 0.02     BNP Recent Labs Lab 02/15/17 2326  BNP 2,748.8*     DDimer No results for input(s): DDIMER in the last 168 hours.   Radiology    Dg Chest 2 View  Result Date: 02/15/2017 CLINICAL DATA:  Chest pain with nausea EXAM: CHEST  2 VIEW COMPARISON:  None. FINDINGS: No acute infiltrate or effusion. Borderline to mild cardiomegaly. Negative for pneumothorax. IMPRESSION: Borderline to mild cardiomegaly.  Negative for edema or infiltrate. Electronically Signed   By: Jasmine Pang  M.D.   On: 02/15/2017 15:26   Ct Angio Chest Pe W And/or Wo Contrast  Result Date: 02/15/2017 CLINICAL DATA:  Chest pain for the past 2 weeks, increased in severity today. EXAM: CT ANGIOGRAPHY CHEST, ABDOMEN AND PELVIS TECHNIQUE: Multidetector CT imaging through the chest, abdomen and pelvis was performed using the standard protocol during bolus administration of intravenous contrast. Multiplanar reconstructed images and MIPs were obtained and reviewed to evaluate the vascular anatomy. CONTRAST:  80 cc Isovue 370 COMPARISON:  Chest radiographs obtained today. FINDINGS: CTA CHEST FINDINGS Cardiovascular: Normally opacified pulmonary arteries with no pulmonary arterial filling defects seen. Refluxed contrast into the azygos vein. Four-chamber enlargement of the heart. No pericardial fluid. Mediastinum/Nodes: No enlarged mediastinal, hilar, or axillary lymph nodes. Thyroid gland, trachea, and esophagus demonstrate no significant findings. Lungs/Pleura: Small to moderate-sized bilateral pleural effusions. Mild heterogeneous accentuation of the interstitial markings  in both lungs. Minimal patchy airspace opacity in the right lower lobe. Prominent pulmonary vasculature in the lungs. Upper abdomen: Refluxed contrast in the inferior vena cava and hepatic veins. Small calcified splenic granuloma. Colonic diverticulosis. Musculoskeletal: Thoracic spine degenerative changes. Review of the MIP images confirms the above findings. IMPRESSION: 1. No pulmonary emboli. 2. Cardiomegaly and changes of acute congestive heart failure. 3. Small amount of alveolar edema or pneumonia in the right lower lobe. Electronically Signed   By: Beckie Salts M.D.   On: 02/15/2017 19:27    Cardiac Studies   Study Conclusions  - Left ventricle: The cavity size was mildly dilated. Systolic   function was severely reduced. The estimated ejection fraction   was in the range of 15% to 20%. Diffuse hypokinesis. Features are   consistent with a pseudonormal left ventricular filling pattern,   with concomitant abnormal relaxation and increased filling   pressure (grade 2 diastolic dysfunction). Doppler parameters are   consistent with high ventricular filling pressure. - Aortic valve: Transvalvular velocity was within the normal range.   There was no stenosis. There was no regurgitation. - Mitral valve: Transvalvular velocity was within the normal range.   There was no evidence for stenosis. There was mild regurgitation.   Valve area by continuity equation (using LVOT flow): 0.11 cm^2.   Effective regurgitant orifice (PISA): 0.11 cm^2. Regurgitant   volume (PISA): 16.6 ml. - Left atrium: The atrium was severely dilated. - Right ventricle: The cavity size was normal. Wall thickness was   normal. Systolic function was normal. - Tricuspid valve: There was mild regurgitation. - Pulmonary arteries: Systolic pressure was within the normal   range. PA peak pressure: 33 mm Hg (S).  Patient Profile     66 y.o. female with a history of HTN and family h/o heart disease who presents to the ED  with complaint of chest pain x 3 daysand exertional dyspnea x 3 weeks. Noted to have epigastric pain with food x several weeks.  Assessment & Plan    1. Acute combined CHF - CXR shows cardiomegaly but negative for edema and infiltrate. BNP 2748. CTA negative for PE.  - Echo showed LVEF of 15-20%, diffuse hypokinesis, grade 2 DD, mild MR, severely dilated LA, PA pressure of 33 mm Hg.  - She is net -5.1L after one dose of IV lasix 60mg  on 02/15/17. Continue coreg and Lostartan. Unable to up -titrate BP due to soft low BP. Likely PRN lasix at discharge.  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 CTA vs lexiscan myoview as outpatient.   2. Difficulty swallowing - Outpatient evaluation.  Follow up with PCP  3. Hypertensive heart disease - BP stable  Dispo: likely DC home later today. Advise to ambulate.      Signed, Manson Passey, PA  02/17/2017, 9:06 AM    History and all data above reviewed.  Patient examined.  I agree with the findings as above.  She is having trouble swallowing foods.  No other bowel issues.  Denies other chest pain.  Breathing is better.    The patient exam reveals COR:RRR  ,  Lungs: Clear  ,  Abd: Positive bowel sounds, no rebound no guarding, Ext No edema  .  All available labs, radiology testing, previous records reviewed. Agree with documented assessment and plan. CARDIOMYOPATHY:  OK to discharge.  I increased the beta blocker slightly.  Needs TOC follow up in [place].  HTN:  Now low.  Meds as listed.  Swallowing:  Needs GI appt to be set up before she goes home.    Fayrene Fearing [facility]  9:43 AM  02/17/2017

## 2017-02-17 NOTE — Discharge Summary (Signed)
Discharge Summary    Patient ID: Denise Anderson,  MRN: [id number], DOB/AGE: 1950-11-19 66 y.o.  Admit date: 02/15/2017 Discharge date: 02/17/2017  Primary Care Provider: Shirlean Mylar Primary Cardiologist: Dr. Royann Shivers   Discharge Diagnoses    Active Problems:   Acute combined CHF CHF (congestive heart failure) [facility])   Hypertensive heart disease   Difficulty swallowing   Allergies No Known Allergies  Diagnostic Studies/Procedures    Echocardiogram 02/16/17 Study Conclusions  - Left ventricle: The cavity size was mildly dilated. Systolic function was severely reduced. The estimated ejection fraction was in the range of 15% to 20%. Diffuse hypokinesis. Features are consistent with a pseudonormal left ventricular filling pattern, with concomitant abnormal relaxation and increased filling pressure (grade 2 diastolic dysfunction). Doppler parameters are consistent with high ventricular filling pressure. - Aortic valve: Transvalvular velocity was within the normal range. There was no stenosis. There was no regurgitation. - Mitral valve: Transvalvular velocity was within the normal range. There was no evidence for stenosis. There was mild regurgitation. Valve area by continuity equation (using LVOT flow): 0.11 cm^2. Effective regurgitant orifice (PISA): 0.11 cm^2. Regurgitant volume (PISA): 16.6 ml. - Left atrium: The atrium was severely dilated. - Right ventricle: The cavity size was normal. Wall thickness was normal. Systolic function was normal. - Tricuspid valve: There was mild regurgitation. - Pulmonary arteries: Systolic pressure was within the normal range. PA peak pressure: 33 mm Hg (S).    History of Present Illness     66 y.o.femalewith a history of HTN and family h/o heart disease who presents to the ED 02/15/17 with complaint of chest pain x 3 daysand exertional dyspnea x 3 weeks. Noted to have epigastric pain with food x several  weeks.  She reports that she was on atenolol several years ago but stopped taking this medication about 2 years ago after her blood pressure was doing better. She reports that her blood pressure has been stable/diet-controlled.  Also family h/o heart disease. She notes her mother had an MI but she is unsure of her age when MI occurred. Her mother was also told that she had a leaky heart valve but never required surgery. Her mother died at age 20. She has no other risk factors. She denies any h/o tobacco use. No h/o DM or HLD.   Hospital Course     Consultants: None  1. Acute combined CHF - CXR shows cardiomegaly but negative for edema and infiltrate. BNP 2748. CTA negative for PE.  - Echo showed LVEF of 15-20%, diffuse hypokinesis, grade 2 DD, mild MR, severely dilated LA, PA pressure of 33 mm Hg.  - She is net -5.1L after one dose of IV lasix 60mg  on 02/15/17. Continue coreg and Lostartan.  BP due to soft low BP--> follow closely. PRN lasix at discharge.  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 CTA vs lexiscan myoview as outpatient.   2. Difficulty swallowing - Noted to have epigastric pain with food x several weeks. Outpatient evaluation. Referral made. Needs to see sooner.   3. Hypertensive heart disease - BP stable  The patient has been seen by Dr. Antoine Poche today and deemed ready for discharge home. All follow-up appointments have been scheduled. Discharge medications are listed below.   Discharge Vitals Blood pressure 100/70, pulse 94, temperature 97.6 F (36.4 C), resp. rate 17, height 5' 4.5" (1.638 m), weight 183 lb 12.8 oz (83.4 kg), SpO2 100 %.  Filed Weights   02/15/17 1436 02/16/17 0611 02/17/17 0530  Weight: 190 lb (86.2 kg) 182 lb 3.2 oz (82.6 kg) 183 lb 12.8 oz (83.4 kg)    Labs & Radiologic Studies     CBC  Recent Labs  02/15/17 1455  WBC 5.4  HGB 12.6  HCT 37.8  MCV 90.0  PLT 222   Basic Metabolic Panel  Recent Labs  02/16/17 0658 02/17/17 0307  NA  139 139  K 3.3* 3.4*  CL 103 102  CO2 24 29  GLUCOSE 92 174*  BUN 24* 26*  CREATININE 1.18* 1.35*  CALCIUM 9.1 8.6*  Cardiac Enzymes  Recent Labs  02/15/17 2326 02/16/17 0658 02/16/17 1219  TROPONINI 0.05* 0.05* 0.04*   Hemoglobin A1C  Recent Labs  02/16/17 1219  HGBA1C 5.8*   Fasting Lipid Panel  Recent Labs  02/17/17 0307  CHOL 157  HDL 53  LDLCALC 81  TRIG 114  CHOLHDL 3.0   Thyroid Function Tests  Recent Labs  02/15/17 2326  TSH 0.453    Dg Chest 2 View  Result Date: 02/15/2017 CLINICAL DATA:  Chest pain with nausea EXAM: CHEST  2 VIEW COMPARISON:  None. FINDINGS: No acute infiltrate or effusion. Borderline to mild cardiomegaly. Negative for pneumothorax. IMPRESSION: Borderline to mild cardiomegaly.  Negative for edema or infiltrate. Electronically Signed   By: Jasmine Pang M.D.   On: 02/15/2017 15:26   Ct Angio Chest Pe W And/or Wo Contrast  Result Date: 02/15/2017 CLINICAL DATA:  Chest pain for the past 2 weeks, increased in severity today. EXAM: CT ANGIOGRAPHY CHEST, ABDOMEN AND PELVIS TECHNIQUE: Multidetector CT imaging through the chest, abdomen and pelvis was performed using the standard protocol during bolus administration of intravenous contrast. Multiplanar reconstructed images and MIPs were obtained and reviewed to evaluate the vascular anatomy. CONTRAST:  80 cc Isovue 370 COMPARISON:  Chest radiographs obtained today. FINDINGS: CTA CHEST FINDINGS Cardiovascular: Normally opacified pulmonary arteries with no pulmonary arterial filling defects seen. Refluxed contrast into the azygos vein. Four-chamber enlargement of the heart. No pericardial fluid. Mediastinum/Nodes: No enlarged mediastinal, hilar, or axillary lymph nodes. Thyroid gland, trachea, and esophagus demonstrate no significant findings. Lungs/Pleura: Small to moderate-sized bilateral pleural effusions. Mild heterogeneous accentuation of the interstitial markings in both lungs. Minimal patchy  airspace opacity in the right lower lobe. Prominent pulmonary vasculature in the lungs. Upper abdomen: Refluxed contrast in the inferior vena cava and hepatic veins. Small calcified splenic granuloma. Colonic diverticulosis. Musculoskeletal: Thoracic spine degenerative changes. Review of the MIP images confirms the above findings. IMPRESSION: 1. No pulmonary emboli. 2. Cardiomegaly and changes of acute congestive heart failure. 3. Small amount of alveolar edema or pneumonia in the right lower lobe. Electronically Signed   By: Beckie Salts M.D.   On: 02/15/2017 19:27    Disposition   Pt is being discharged home today in good condition.  Follow-up Plans & Appointments    Follow-up Information    Barrett, Joline Salt, PA-C. Go on 02/23/2017.   Specialties:  Cardiology, Radiology Why:  @9am  for TCM follow up  Office will call with GI referral appointment  Contact information: [address] [place] [address] [phone]          Discharge Instructions    Diet - low sodium heart healthy    Complete by:  As directed    Discharge instructions    Complete by:  As directed    *Weigh yourself on the same scale at same time of day and keep a log. *Report weight gain of >  3 lbs in 1 day or 5 lbs over the course of a week and/or symptoms of excess fluid (shortness of breath, difficulty lying flat, swelling, poor appetite, abdominal fullness/bloating, etc) to your doctor immediately. *Avoid foods that are high in sodium (processed, pre-packaged/canned goods, fast foods, etc). *Please attend all scheduled and reccommended follow up appointments   Increase activity slowly    Complete by:  As directed       Discharge Medications   Current Discharge Medication List    START taking these medications   Details  aspirin EC 81 MG EC tablet Take 1 tablet (81 mg total) by mouth daily. Qty: 30 tablet, Refills: 6    carvedilol (COREG) 6.25 MG tablet Take 1 tablet (6.25 mg total) by  mouth 2 (two) times daily with a meal. Qty: 60 tablet, Refills: 6    furosemide (LASIX) 20 MG tablet Take one table by mouth as needed for shortness of breath Qty: 30 tablet, Refills: 2    losartan (COZAAR) 50 MG tablet Take 1 tablet (50 mg total) by mouth daily. Qty: 30 tablet, Refills: 6      CONTINUE these medications which have NOT CHANGED   Details  Garlic 2000 MG CAPS Take 4,000 mg by mouth daily.    OVER THE COUNTER MEDICATION Take 1 capsule by mouth 2 (two) times daily. [place] Capsule      STOP taking these medications     azithromycin (ZITHROMAX) 250 MG tablet      predniSONE (DELTASONE) 20 MG tablet        Outstanding Labs/Studies   Ischemic evaluation   Duration of Discharge Encounter   Greater than 30 minutes including physician time.  Signed, Bhagat,Bhavinkumar PA-C 02/17/2017, 10:09 AM  I saw the patient earlier. Refer to rounding note.  All labs/studies reviewed.  Plans for discharge discussed and noted above.

## 2017-02-19 ENCOUNTER — Telehealth (HOSPITAL_COMMUNITY): Payer: Self-pay | Admitting: Surgery

## 2017-02-19 NOTE — Telephone Encounter (Signed)
Heart Failure Nurse Navigator Post Discharge Telephone Call I called to check in on Denise Anderson after her recent hospitalization.  She says that she is doing "very well".  She is weighing each day and weight today was 179.4lbs versus last documented weight in hospital was 183lbs on 9/5.  She denies any issues getting or taking prescribed medications.   She has a follow-up appt scheduled on 02/23/17 at 0900 with [facility].  I have encouraged her to call me with any concerns or questions related to her HF.

## 2017-02-22 ENCOUNTER — Telehealth: Payer: Self-pay | Admitting: Physician Assistant

## 2017-02-22 NOTE — Telephone Encounter (Signed)
New message    Pt is calling to find out if she needs to fast for appt tomorrow. Please call.

## 2017-02-22 NOTE — Telephone Encounter (Signed)
Discussed with Theodore Demark, PA. Patient does not need to be fasting tomorrow. Patient inquired about lotions/creams. She was advised to use these sparingly as she may need a repeat ECG tomorrow. Patient verbalized understanding and agreed with plan.

## 2017-02-23 ENCOUNTER — Ambulatory Visit (INDEPENDENT_AMBULATORY_CARE_PROVIDER_SITE_OTHER): Payer: Medicare Other | Admitting: Physician Assistant

## 2017-02-23 ENCOUNTER — Encounter: Payer: Self-pay | Admitting: Physician Assistant

## 2017-02-23 VITALS — BP 126/80 | HR 92 | Ht 66.0 in | Wt 179.0 lb

## 2017-02-23 DIAGNOSIS — I429 Cardiomyopathy, unspecified: Secondary | ICD-10-CM

## 2017-02-23 DIAGNOSIS — I5042 Chronic combined systolic (congestive) and diastolic (congestive) heart failure: Secondary | ICD-10-CM

## 2017-02-23 DIAGNOSIS — Z79899 Other long term (current) drug therapy: Secondary | ICD-10-CM | POA: Diagnosis not present

## 2017-02-23 LAB — BASIC METABOLIC PANEL
BUN/Creatinine Ratio: 18 (ref 12–28)
BUN: 19 mg/dL (ref 8–27)
CALCIUM: 9.4 mg/dL (ref 8.7–10.3)
CO2: 22 mmol/L (ref 20–29)
CREATININE: 1.06 mg/dL — AB (ref 0.57–1.00)
Chloride: 102 mmol/L (ref 96–106)
GFR, EST AFRICAN AMERICAN: 63 mL/min/{1.73_m2} (ref >59)
GFR, EST NON AFRICAN AMERICAN: 55 mL/min/{1.73_m2} — AB (ref >59)
Glucose: 146 mg/dL — ABNORMAL HIGH (ref 65–99)
POTASSIUM: 3.9 mmol/L (ref 3.5–5.2)
Sodium: 137 mmol/L (ref 134–144)

## 2017-02-23 MED ORDER — FUROSEMIDE 20 MG PO TABS: ORAL_TABLET | ORAL | 2 refills | Status: AC

## 2017-02-23 MED ORDER — CARVEDILOL 6.25 MG PO TABS: 9.3750 mg | ORAL_TABLET | Freq: Two times a day (BID) | ORAL | 6 refills | Status: DC

## 2017-02-23 NOTE — Progress Notes (Signed)
Cardiology Office Note   Date:  02/23/2017   ID:  Denise Anderson, DOB 11/23/1950, MRN [id number]  PCP:  Shirlean Mylar, MD  Cardiologist:  Dr. Royann Shivers, 02/15/2017 in hospital  Cornella Emmer, Bjorn Loser, [place]   Chief Complaint  Patient presents with  . Follow-up    ED admit for chest pain. Pain from right buttocks to foot.  . Chest Pain    This morning.    History of Present Illness: Denise Anderson is a 66 y.o. female with a history of HTN and family h/o heart disease.  Admitted 09/3-09/10/2016 for chest pain and shortness of breath >>acute combined CHF with an EF of 15-20%. She was started on carvedilol and losartan. Her blood pressure was soft so doses are low. It was recommended that she have CTA versus Lexi scan Myoview as an outpatient. GI referral made for epigastric pain. Weight 183 pounds at discharge  Denise Anderson presents for cardiology follow up.   She has not had chest pain. She is working on a low-Na diet, doesn't like Mrs Sharilyn Sites. She likes hot peppers.   She has only taken Lasix once because she had some SOB, it got better.   She is able to go up and down stairs without stopping, that is much better than before the hosptalization.       She has had some chest pain, after meals, sharp and through to her back. It resolved with position change and stretching.   She had some pain in her back that went down her R leg, never had that before. She had not been moving around that much Before she got that pain.  Her daughter is here with her. The daughter states they are all eating a lower sodium diet. They're willing to work on tweaking that to make it better.  The patient feels she is doing much better now than she was before she had to go to the hospital.  She has not yet seen GI, we will make the referral.   Past Medical History:  Diagnosis Date  . CHF (congestive heart failure), NYHA class II, acute, combined (HCC) 02/15/2017  . Hypertension   . Leukopenia   . Shingles      Past Surgical History:  Procedure Laterality Date  . TUBAL LIGATION      Current Outpatient Prescriptions  Medication Sig Dispense Refill  . aspirin EC 81 MG EC tablet Take 1 tablet (81 mg total) by mouth daily. 30 tablet 6  . carvedilol (COREG) 6.25 MG tablet Take 1.5 tablets (9.375 mg total) by mouth 2 (two) times daily with a meal. 60 tablet 6  . furosemide (LASIX) 20 MG tablet Take one tablet-po PRN forSOB AND WT GAIN OF >3#qd/>5#WK 30 tablet 2  . Garlic 2000 MG CAPS Take 4,000 mg by mouth daily.    Marland Kitchen losartan (COZAAR) 50 MG tablet Take 1 tablet (50 mg total) by mouth daily. 30 tablet 6  . OVER THE COUNTER MEDICATION Take 1 capsule by mouth 2 (two) times daily. Moringa Capsule     No current facility-administered medications for this visit.     Allergies:   Patient has no known allergies.    Social History:  The patient  reports that she has never smoked. She has never used smokeless tobacco. She reports that she does not drink alcohol or use drugs.   Family History:  The patient's family history includes CAD in her father and mother; CVA in her brother; Valvular heart disease in her  mother.    ROS:  Please see the history of present illness. All other systems are reviewed and negative.    PHYSICAL EXAM: VS:  BP 126/80   Pulse 92   Ht  (1.676 m)   Wt 179 lb (81.2 kg)   BMI 28.89 kg/m  , BMI Body mass index is 28.89 kg/m. GEN: Well nourished, well developed, female in no acute distress  HEENT: normal for age  Neck: JVD 9-10 cm, no carotid bruit, no masses Cardiac: RRR; soft murmur, no rubs, or gallops Respiratory:  clear to auscultation bilaterally, normal work of breathing GI: soft, nontender, nondistended, + BS MS: no deformity or atrophy; no edema; distal pulses are 2+ in all 4 extremities   Skin: warm and dry, no rash Neuro:  Strength and sensation are intact Psych: euthymic mood, full affect   EKG:  EKG is not ordered today.  Echocardiogram  02/16/17 Study Conclusions - Left ventricle: The cavity size was mildly dilated. Systolic function was severely reduced. The estimated ejection fraction was in the range of 15% to 20%. Diffuse hypokinesis. Features are consistent with a pseudonormal left ventricular filling pattern, with concomitant abnormal relaxation and increased filling pressure (grade 2 diastolic dysfunction). Doppler parameters are consistent with high ventricular filling pressure. - Aortic valve: Transvalvular velocity was within the normal range. There was no stenosis. There was no regurgitation. - Mitral valve: Transvalvular velocity was within the normal range. There was no evidence for stenosis. There was mild regurgitation. Valve area by continuity equation (using LVOT flow): 0.11 cm^2. Effective regurgitant orifice (PISA): 0.11 cm^2. Regurgitant volume (PISA): 16.6 ml. - Left atrium: The atrium was severely dilated. - Right ventricle: The cavity size was normal. Wall thickness was normal. Systolic function was normal. - Tricuspid valve: There was mild regurgitation. - Pulmonary arteries: Systolic pressure was within the normal range. PA peak pressure: 33 mm Hg (S).   Recent Labs: 02/15/2017: B Natriuretic Peptide 2,748.8; Hemoglobin 12.6; Platelets 222; TSH 0.453 02/17/2017: BUN 26; Creatinine, Ser 1.35; Potassium 3.4; Sodium 139    Lipid Panel    Component Value Date/Time   CHOL 157 02/17/2017 0307   TRIG 114 02/17/2017 0307   HDL 53 02/17/2017 0307   CHOLHDL 3.0 02/17/2017 0307   VLDL 23 02/17/2017 0307   LDLCALC 81 02/17/2017 0307     Wt Readings from Last 3 Encounters:  02/23/17 179 lb (81.2 kg)  02/17/17 183 lb 12.8 oz (83.4 kg)  07/18/14 185 lb 11.2 oz (84.2 kg)     Other studies Reviewed: Additional studies/ records that were reviewed today include: Hospital records and testing.  ASSESSMENT AND PLAN:  1.  Chronic combined systolic and diastolic CHF: Her  lifestyle has improved immensely. She is determined to be aggressive about managing this. She is encouraged in this. We discussed different options for dietary things to get into more flavor without adding sodium. It is okay to use small amounts of NoSalt. Continue aspirin, and when necessary Lasix. She is also on losartan. She is on carvedilol, but her heart rate is elevated so I will increase this dose. If she tolerates that, consideration can be given to changing the losartan to Entresto at her next office visit. Check BMET today  2. Cardiomyopathy: It is unknown if her cardiomyopathy is ischemic or nonischemic. I explained this to her and stated that we needed to do a stress test or other testing. She prefers a non-dye-related test. Therefore, we will schedule a Lexi scan Myoview. Follow  up on those results.  3. Lifestyle: Continue low sodium diet, daily weights. It was noted that her HgbA1c was borderline elevated at 5.8. She is encouraged to limit carbohydrates and processed sugars to avoid becoming a diabetic.   Current medicines are reviewed at length with the patient today.  The patient does not have concerns regarding medicines.  The following changes have been made:  Increase carvedilol  Labs/ tests ordered today include:   Orders Placed This Encounter  Procedures  . Basic metabolic panel  . Myocardial Perfusion Imaging     Disposition:   FU with Dr. Royann Shivers  Signed, Theodore Demark, PA-C  02/23/2017 1:28 PM    [facility] Phone: [phone]; Fax: [phone]  This note was written with the assistance of speech recognition software. Please excuse any transcriptional errors.

## 2017-02-23 NOTE — Patient Instructions (Signed)
Medication Instructions:  CHANGE LASIX 20MG  AS NEEDED FOR sob  OR WEIGHT GAIN OF > 3LBS IN ONE DAY -OR- >5LBS IN ONE WEEK  INCREASE COREG 9.375MG  TWICE DAILY (1.5 TAB) If you need a refill on your cardiac medications before your next appointment, please call your pharmacy.  Labwork: BMET TODAY HERE IN OUR OFFICE AT [facility]  Testing/Procedures: Your physician has requested that you have a lexiscan myoview. For further information please visit [email or web address] Please follow instruction sheet, as given.  Follow-Up: Your physician wants you to follow-up: AFTER LEXISCAN WITH DR [id number].    Special Instructions: LIMIT CARB'S AND PROCESSED SUGAR  OK TO USE HOT SAUCE  NO SALT-LIMIT SALT INTAKE  2,000MG  LOW SODIUM DIET  Thank you for choosing [facility]     2,000MG  DAILY LOW SODIUM DIET DASH Eating Plan DASH stands for "Dietary Approaches to Stop Hypertension." The DASH eating plan is a healthy eating plan that has been shown to reduce high blood pressure (hypertension). It may also reduce your risk for type 2 diabetes, heart disease, and stroke. The DASH eating plan may also help with weight loss. What are tips for following this plan? General guidelines  Avoid eating more than 2,300 mg (milligrams) of salt (sodium) a day. If you have hypertension, you may need to reduce your sodium intake to 1,500 mg a day.  Limit alcohol intake to no more than 1 drink a day for nonpregnant women and 2 drinks a day for men. One drink equals 12 oz of beer, 5 oz of wine, or 1 oz of hard liquor.  Work with your health care provider to maintain a healthy body weight or to lose weight. Ask what an ideal weight is for you.  Get at least 30 minutes of exercise that causes your heart to beat faster (aerobic exercise) most days of the week. Activities may include walking, swimming, or biking.  Work with your health care provider or diet and nutrition specialist (dietitian) to  adjust your eating plan to your individual calorie needs. Reading food labels  Check food labels for the amount of sodium per serving. Choose foods with less than 5 percent of the Daily Value of sodium. Generally, foods with less than 300 mg of sodium per serving fit into this eating plan.  To find whole grains, look for the word "whole" as the first word in the ingredient list. Shopping  Buy products labeled as "low-sodium" or "no salt added."  Buy fresh foods. Avoid canned foods and premade or frozen meals. Cooking  Avoid adding salt when cooking. Use salt-free seasonings or herbs instead of table salt or sea salt. Check with your health care provider or pharmacist before using salt substitutes.  Do not fry foods. Cook foods using healthy methods such as baking, boiling, grilling, and broiling instead.  Cook with heart-healthy oils, such as olive, canola, soybean, or sunflower oil. Meal planning   Eat a balanced diet that includes: ? 5 or more servings of fruits and vegetables each day. At each meal, try to fill half of your plate with fruits and vegetables. ? Up to 6-8 servings of whole grains each day. ? Less than 6 oz of lean meat, poultry, or fish each day. A 3-oz serving of meat is about the same size as a deck of cards. One egg equals 1 oz. ? 2 servings of low-fat dairy each day. ? A serving of nuts, seeds, or beans 5 times each week. ? Heart-healthy fats. Healthy fats  called Omega-3 fatty acids are found in foods such as flaxseeds and coldwater fish, like sardines, salmon, and mackerel.  Limit how much you eat of the following: ? Canned or prepackaged foods. ? Food that is high in trans fat, such as fried foods. ? Food that is high in saturated fat, such as fatty meat. ? Sweets, desserts, sugary drinks, and other foods with added sugar. ? Full-fat dairy products.  Do not salt foods before eating.  Try to eat at least 2 vegetarian meals each week.  Eat more home-cooked  food and less restaurant, buffet, and fast food.  When eating at a restaurant, ask that your food be prepared with less salt or no salt, if possible. What foods are recommended? The items listed may not be a complete list. Talk with your dietitian about what dietary choices are best for you. Grains Whole-grain or whole-wheat bread. Whole-grain or whole-wheat pasta. Brown rice. Orpah Cobb. Bulgur. Whole-grain and low-sodium cereals. Pita bread. Low-fat, low-sodium crackers. Whole-wheat flour tortillas. Vegetables Fresh or frozen vegetables (raw, steamed, roasted, or grilled). Low-sodium or reduced-sodium tomato and vegetable juice. Low-sodium or reduced-sodium tomato sauce and tomato paste. Low-sodium or reduced-sodium canned vegetables. Fruits All fresh, dried, or frozen fruit. Canned fruit in natural juice (without added sugar). Meat and other protein foods Skinless chicken or [place]. Ground chicken or [place]. Pork with fat trimmed off. Fish and seafood. Egg whites. Dried beans, peas, or lentils. Unsalted nuts, nut butters, and seeds. Unsalted canned beans. Lean cuts of beef with fat trimmed off. Low-sodium, lean deli meat. Dairy Low-fat (1%) or fat-free (skim) milk. Fat-free, low-fat, or reduced-fat cheeses. Nonfat, low-sodium ricotta or cottage cheese. Low-fat or nonfat yogurt. Low-fat, low-sodium cheese. Fats and oils Soft margarine without trans fats. Vegetable oil. Low-fat, reduced-fat, or light mayonnaise and salad dressings (reduced-sodium). Canola, safflower, olive, soybean, and sunflower oils. Avocado. Seasoning and other foods Herbs. Spices. Seasoning mixes without salt. Unsalted popcorn and pretzels. Fat-free sweets. What foods are not recommended? The items listed may not be a complete list. Talk with your dietitian about what dietary choices are best for you. Grains Baked goods made with fat, such as croissants, muffins, or some breads. Dry pasta or rice meal  packs. Vegetables Creamed or fried vegetables. Vegetables in a cheese sauce. Regular canned vegetables (not low-sodium or reduced-sodium). Regular canned tomato sauce and paste (not low-sodium or reduced-sodium). Regular tomato and vegetable juice (not low-sodium or reduced-sodium). Rosita Fire. Olives. Fruits Canned fruit in a light or heavy syrup. Fried fruit. Fruit in cream or butter sauce. Meat and other protein foods Fatty cuts of meat. Ribs. Fried meat. Tomasa Blase. Sausage. Bologna and other processed lunch meats. Salami. Fatback. Hotdogs. Bratwurst. Salted nuts and seeds. Canned beans with added salt. Canned or smoked fish. Whole eggs or egg yolks. Chicken or [place] with skin. Dairy Whole or 2% milk, cream, and half-and-half. Whole or full-fat cream cheese. Whole-fat or sweetened yogurt. Full-fat cheese. Nondairy creamers. Whipped toppings. Processed cheese and cheese spreads. Fats and oils Butter. Stick margarine. Lard. Shortening. Ghee. Bacon fat. Tropical oils, such as coconut, palm kernel, or palm oil. Seasoning and other foods Salted popcorn and pretzels. Onion salt, garlic salt, seasoned salt, table salt, and sea salt. Worcestershire sauce. Tartar sauce. Barbecue sauce. Teriyaki sauce. Soy sauce, including reduced-sodium. Steak sauce. Canned and packaged gravies. Fish sauce. Oyster sauce. Cocktail sauce. Horseradish that you find on the shelf. Ketchup. Mustard. Meat flavorings and tenderizers. Bouillon cubes. Hot sauce and Tabasco sauce. Premade or packaged marinades.  Premade or packaged taco seasonings. Relishes. Regular salad dressings. Where to find more information:  National Heart, Lung, and Blood Institute: PopSteam.is  American Heart Association: www.heart.org Summary  The DASH eating plan is a healthy eating plan that has been shown to reduce high blood pressure (hypertension). It may also reduce your risk for type 2 diabetes, heart disease, and stroke.  With the DASH eating  plan, you should limit salt (sodium) intake to 2,300 mg a day. If you have hypertension, you may need to reduce your sodium intake to 1,500 mg a day.  When on the DASH eating plan, aim to eat more fresh fruits and vegetables, whole grains, lean proteins, low-fat dairy, and heart-healthy fats.  Work with your health care provider or diet and nutrition specialist (dietitian) to adjust your eating plan to your individual calorie needs. This information is not intended to replace advice given to you by your health care provider. Make sure you discuss any questions you have with your health care provider. Document Released: 05/21/2011 Document Revised: 05/25/2016 Document Reviewed: 05/25/2016 Elsevier Interactive Patient Education  2017 [employer].

## 2017-02-25 ENCOUNTER — Telehealth (HOSPITAL_COMMUNITY): Payer: Self-pay

## 2017-02-25 NOTE — Telephone Encounter (Signed)
Encounter complete. 

## 2017-03-02 ENCOUNTER — Ambulatory Visit (HOSPITAL_COMMUNITY)
Admission: RE | Admit: 2017-03-02 | Discharge: 2017-03-02 | Disposition: A | Discharge status: Home / Self Care | Payer: Medicare Other | Source: Ambulatory Visit | Attending: Cardiovascular Disease | Admitting: Cardiovascular Disease

## 2017-03-02 DIAGNOSIS — I429 Cardiomyopathy, unspecified: Secondary | ICD-10-CM | POA: Insufficient documentation

## 2017-03-02 LAB — MYOCARDIAL PERFUSION IMAGING
CHL CUP RESTING HR STRESS: 87 {beats}/min
CSEPPHR: 110 {beats}/min
LV sys vol: 107 mL
LVDIAVOL: 142 mL (ref 46–106)
NUC STRESS TID: 1.11
SDS: 1
SRS: 0
SSS: 1

## 2017-03-02 MED ORDER — REGADENOSON 0.4 MG/5ML IV SOLN
0.4000 mg | Freq: Once | INTRAVENOUS | Status: AC
  Administered 2017-03-02: 0.4 mg via INTRAVENOUS

## 2017-03-02 MED ORDER — TECHNETIUM TC 99M TETROFOSMIN IV KIT
10.1000 | PACK | Freq: Once | INTRAVENOUS | Status: AC | PRN
  Administered 2017-03-02: 10.1 via INTRAVENOUS
  Filled 2017-03-02: qty 11

## 2017-03-02 MED ORDER — TECHNETIUM TC 99M TETROFOSMIN IV KIT
31.2000 | PACK | Freq: Once | INTRAVENOUS | Status: AC | PRN
  Administered 2017-03-02: 31.2 via INTRAVENOUS
  Filled 2017-03-02: qty 32

## 2017-03-03 DIAGNOSIS — R1013 Epigastric pain: Secondary | ICD-10-CM | POA: Diagnosis not present

## 2017-03-17 ENCOUNTER — Ambulatory Visit (INDEPENDENT_AMBULATORY_CARE_PROVIDER_SITE_OTHER): Payer: Medicare Other | Admitting: Physician Assistant

## 2017-03-17 ENCOUNTER — Encounter: Payer: Self-pay | Admitting: Physician Assistant

## 2017-03-17 VITALS — BP 130/76 | HR 65 | Ht 65.5 in | Wt 176.8 lb

## 2017-03-17 DIAGNOSIS — I428 Other cardiomyopathies: Secondary | ICD-10-CM | POA: Diagnosis not present

## 2017-03-17 DIAGNOSIS — I5022 Chronic systolic (congestive) heart failure: Secondary | ICD-10-CM | POA: Diagnosis not present

## 2017-03-17 MED ORDER — SACUBITRIL-VALSARTAN 24-26 MG PO TABS: 1.0000 | ORAL_TABLET | Freq: Two times a day (BID) | ORAL | 5 refills | Status: DC

## 2017-03-17 MED ORDER — SACUBITRIL-VALSARTAN 24-26 MG PO TABS: 1.0000 | ORAL_TABLET | Freq: Two times a day (BID) | ORAL | Status: DC

## 2017-03-17 NOTE — Progress Notes (Signed)
Cardiology Office Note   Date:  03/17/2017   ID:  Denise Anderson, DOB Mar 19, 1951, MRN [id number]  PCP:  Shirlean Mylar, MD  Cardiologist:  Dr. Royann Shivers, 02/15/2017 in hospital  Barrett, Deneen Harts 02/23/2017  Chief Complaint  Patient presents with  . Follow-up    History of Present Illness: Denise Anderson is a 66 y.o. female with a history of HTN, FH CAD, S-D-CHF dx 02/15/2017 in-hospital w/ EF 15-20%  Office visit 02/23/2017, blood pressure limits titration of carvedilol and losartan, weight 179 pounds, lifestyle greatly improved Coreg increased, consider changing losartan to Entresto at next visit 03/02/2017 Myoview without scar or ischemia but a high risk due to EF 25%, findings c/w NICM  Denise Anderson presents for cardiology follow up.  She is weighing daily at home. Her weight is trending down slowly. No weight gains.   She is working on the low Na diet. She is eating very differently, but thinks she can live with this. She has found a low-Na dressing she likes. Fruit is ok. She feels good on this new eating plan and is motivated to continue it.  Her breathing is good. She is going up and down steps like normal. No LE edema. No orthopnea or PND. She rarely takes the Lasix.   No palpitations, no presyncope or syncope.   She has not been exercising, but it is willing to increase her activity.   Past Medical History:  Diagnosis Date  . CHF (congestive heart failure), NYHA class II, acute, combined (HCC) 02/15/2017  . Hypertension   . Leukopenia   . Shingles     Past Surgical History:  Procedure Laterality Date  . TUBAL LIGATION      Current Outpatient Prescriptions  Medication Sig Dispense Refill  . aspirin EC 81 MG EC tablet Take 1 tablet (81 mg total) by mouth daily. 30 tablet 6  . carvedilol (COREG) 6.25 MG tablet Take 1.5 tablets (9.375 mg total) by mouth 2 (two) times daily with a meal. 60 tablet 6  . furosemide (LASIX) 20 MG tablet Take one tablet-po PRN forSOB  AND WT GAIN OF >3#qd/>5#WK 30 tablet 2  . Garlic 2000 MG CAPS Take 4,000 mg by mouth daily.    Marland Kitchen losartan (COZAAR) 50 MG tablet Take 1 tablet (50 mg total) by mouth daily. 30 tablet 6  . OVER THE COUNTER MEDICATION Take 1 capsule by mouth 2 (two) times daily. Moringa Capsule     No current facility-administered medications for this visit.     Allergies:   Patient has no known allergies.    Social History:  The patient  reports that she has never smoked. She has never used smokeless tobacco. She reports that she does not drink alcohol or use drugs.   Family History:  The patient's family history includes CAD in her father and mother; CVA in her brother; Valvular heart disease in her mother.    ROS:  Please see the history of present illness. All other systems are reviewed and negative.    PHYSICAL EXAM: VS:  BP 130/76   Pulse 65   Ht 5' 5.5" (1.664 m)   Wt 176 lb 12.8 oz (80.2 kg)   SpO2 98%   BMI 28.97 kg/m  , BMI Body mass index is 28.97 kg/m. GEN: Well nourished, well developed, female in no acute distress  HEENT: normal for age  Neck: JVD 9 cm on R, no carotid bruit, no masses Cardiac: RRR; soft murmur, no rubs, or gallops  Respiratory:  clear to auscultation bilaterally, normal work of breathing GI: soft, nontender, nondistended, + BS MS: no deformity or atrophy; no edema; distal pulses are 2+ in all 4 extremities; varicose veins noted but are not tender   Skin: warm and dry, no rash Neuro:  Strength and sensation are intact Psych: euthymic mood, full affect   EKG:  EKG is not ordered today.  MYOVIEW: 03/02/2017  Nuclear stress EF: 25%.  The left ventricular ejection fraction is severely decreased (<30%).  There was no ST segment deviation noted during stress.  This is a high risk study.  High risk stress nuclear study due to severely reduced left ventricular global systolic function. No perfusion abnormalities are seen. The left ventricle is dilated. The  findings are most consistent with nonischemic cardiomyopathy.  ECHO: 02/16/2017 - Left ventricle: The cavity size was mildly dilated. Systolic   function was severely reduced. The estimated ejection fraction   was in the range of 15% to 20%. Diffuse hypokinesis. Features are   consistent with a pseudonormal left ventricular filling pattern,   with concomitant abnormal relaxation and increased filling   pressure (grade 2 diastolic dysfunction). Doppler parameters are   consistent with high ventricular filling pressure. - Aortic valve: Transvalvular velocity was within the normal range.   There was no stenosis. There was no regurgitation. - Mitral valve: Transvalvular velocity was within the normal range.   There was no evidence for stenosis. There was mild regurgitation.   Valve area by continuity equation (using LVOT flow): 0.11 cm^2.   Effective regurgitant orifice (PISA): 0.11 cm^2. Regurgitant   volume (PISA): 16.6 ml. - Left atrium: The atrium was severely dilated. - Right ventricle: The cavity size was normal. Wall thickness was   normal. Systolic function was normal. - Tricuspid valve: There was mild regurgitation. - Pulmonary arteries: Systolic pressure was within the normal   range. PA peak pressure: 33 mm Hg (S).   Recent Labs: 02/15/2017: B Natriuretic Peptide 2,748.8; Hemoglobin 12.6; Platelets 222; TSH 0.453 02/23/2017: BUN 19; Creatinine, Ser 1.06; Potassium 3.9; Sodium 137    Lipid Panel    Component Value Date/Time   CHOL 157 02/17/2017 0307   TRIG 114 02/17/2017 0307   HDL 53 02/17/2017 0307   CHOLHDL 3.0 02/17/2017 0307   VLDL 23 02/17/2017 0307   LDLCALC 81 02/17/2017 0307     Wt Readings from Last 3 Encounters:  03/17/17 176 lb 12.8 oz (80.2 kg)  03/02/17 179 lb (81.2 kg)  02/23/17 179 lb (81.2 kg)     Other studies Reviewed: Additional studies/ records that were reviewed today include: Office notes, hospital records and testing.  ASSESSMENT AND  PLAN:  1.  Chronic systolic CHF: Her weight is a little bit lower but stable. She is doing well from a respiratory standpoint. She has made extensive lifestyle modifications. She is tolerating her medications well. Her heart rate is slower on the higher dose of carvedilol. Her renal function was checked at her last office visit and was stable on the losartan.     I will stop the losartan and start Entresto 24-26 milligrams. She will have lab work done in 3 weeks to make sure her renal function tolerates this. RN/Provider visit at the time of her blood work to titrate the [employer].  2. Nonischemic cardiomyopathy: I discussed an ICD and the Life Vest. I explained that she can wear the LifeVest now, but if her EF does not improve, she will be a candidate for an  ICD. She does not wish to wear the LifeVest at this time. Check an echocardiogram prior to her follow-up with Dr. Royann Shivers in 3 months, then discuss an ICD if her EF has not improved.   Current medicines are reviewed at length with the patient today.  The patient does not have concerns regarding medicines.  The following changes have been made:  Stop losartan, start Entresto  Labs/ tests ordered today include:   Orders Placed This Encounter  Procedures  . Basic metabolic panel  . ECHOCARDIOGRAM COMPLETE     Disposition:   FU with Dr. Royann Shivers  Signed, Theodore Demark, PA-C  03/17/2017 2:52 PM    [facility] Phone: [phone]; Fax: [phone]  This note was written with the assistance of speech recognition software. Please excuse any transcriptional errors.

## 2017-03-17 NOTE — Progress Notes (Signed)
Thanks, Bjorn Loser. Agree w plan MCr

## 2017-03-17 NOTE — Patient Instructions (Addendum)
Medication Instructions:  STOP LOSARTAN AND START ENTRESTO 24-26MG  If you need a refill on your cardiac medications before your next appointment, please call your pharmacy.  Labwork: BMET IN 3 WEEK AFTER STARTING ENTRESTO(04-07-17) HERE IN OUR OFFICE AT [facility]  Testing/Procedures: Your physician has requested that you have an echocardiogram. Echocardiography is a painless test that uses sound waves to create images of your heart. It provides your doctor with information about the size and shape of your heart and how well your heart's chambers and valves are working. This procedure takes approximately one hour. There are no restrictions for this procedure. SCHEDULE IN 3 MONTHS  Follow-Up: Your physician wants you to follow-up in: IN 3 MONTHS WITH DR CROITORU-AFTER ECHO.    Thank you for choosing [facility] at [facility]!!

## 2017-03-18 ENCOUNTER — Telehealth: Payer: Self-pay

## 2017-03-18 NOTE — Telephone Encounter (Signed)
Denise Anderson wants pt to come in for BMET in 3 weeks ~ 04-07-2017 and have BP nurse visit to check to see if Sherryll Burger needs to be increased. Have pt bring BP log with her for provider review.

## 2017-03-18 NOTE — Telephone Encounter (Signed)
Scheduled appt for 10-25 @9am  for BP check per Pt

## 2017-03-22 ENCOUNTER — Telehealth: Payer: Self-pay | Admitting: Physician Assistant

## 2017-03-22 NOTE — Telephone Encounter (Signed)
Pt.notified

## 2017-03-22 NOTE — Telephone Encounter (Signed)
Pt would like to know if she can take ALEVE  For her back pain.  Please giver her a call back.

## 2017-03-22 NOTE — Telephone Encounter (Signed)
Please let her know ok to take low-dose Aleve prn If it causes swelling, she will have to stick to Tylenol. Thanks

## 2017-04-08 ENCOUNTER — Ambulatory Visit (INDEPENDENT_AMBULATORY_CARE_PROVIDER_SITE_OTHER): Payer: Medicare Other | Admitting: *Deleted

## 2017-04-08 ENCOUNTER — Other Ambulatory Visit: Payer: Self-pay

## 2017-04-08 VITALS — BP 106/70 | HR 72 | Ht 65.5 in | Wt 179.0 lb

## 2017-04-08 DIAGNOSIS — I428 Other cardiomyopathies: Secondary | ICD-10-CM

## 2017-04-08 DIAGNOSIS — I5022 Chronic systolic (congestive) heart failure: Secondary | ICD-10-CM

## 2017-04-08 LAB — BASIC METABOLIC PANEL
BUN/Creatinine Ratio: 17 (ref 12–28)
BUN: 16 mg/dL (ref 8–27)
CALCIUM: 9.6 mg/dL (ref 8.7–10.3)
CO2: 28 mmol/L (ref 20–29)
CREATININE: 0.93 mg/dL (ref 0.57–1.00)
Chloride: 104 mmol/L (ref 96–106)
GFR calc Af Amer: 74 mL/min/{1.73_m2} (ref >59)
GFR calc non Af Amer: 64 mL/min/{1.73_m2} (ref >59)
GLUCOSE: 84 mg/dL (ref 65–99)
POTASSIUM: 5.4 mmol/L — AB (ref 3.5–5.2)
SODIUM: 142 mmol/L (ref 134–144)

## 2017-04-08 NOTE — Patient Instructions (Signed)
Your physician recommends that you continue on your current medications as directed. Please refer to the Current Medication list given to you today.  We will call you with your lab results.   Please complete patient assistance application for Entresto and return to our office with all necessary "patient" requirements. The PA or MD will complete and send in on your behalf to see if you qualify for assistance thru the drug company.  Reminder: all Medicare plans have a drug deductible that has to be met at the start of each new year before they assist with prescription drug costs, so you may have to pay more for this medication out of pocket initially

## 2017-04-08 NOTE — Progress Notes (Addendum)
1.) Reason for visit: BP check for entresto titration  2.) Name of MD requesting visit: Rhonda, PA  3.) H&P: chronic systolic HF, non-ischemic cardiomyopathy  4.) ROS related to problem: tolerating entresto at present dose, no side effects; reports weight gain/swelling ONLY depending on dietary choices; denies SOB & chest pain  5.) Assessment and plan per MD:  a. BP 106/70, HR 72, Ht: 5'5.5", Wt: 179 lbs b. Reviewed history & BP from visit with Denise Anderson, PharmD - given BP reading from today, entresto dose should remain at 24-26mg  BID.  * patient to have BMET today * 1 box samples provided + patient assistance paperwork * patient reports she may have issues w/cost of medication - advised to call office for samples and educated on drug deductible for Medicare plans so she is aware that cost may initially be higher at start of new year before plan kicks in and pays for drug * scheduled patient for echo f/up +3 month visit with Dr. [name] on 07/08/17

## 2017-04-08 NOTE — Progress Notes (Signed)
Thanks Can you please include the BP in the BP check note? MCr

## 2017-04-12 ENCOUNTER — Telehealth: Payer: Self-pay

## 2017-04-12 NOTE — Telephone Encounter (Signed)
Barrett, Denise Salt, PA-C  Alyson Ingles, LPN See below, Please let her know that Dr. Royann Shivers just does not feel the medicine should have raised her potassium level. Please have her restart the Entresto at previous dose. Recheck blood work in 2 weeks.  Make sure she is not taking any potassium supplements.  Thanks Previous Messages ---- Message -----  From: Thurmon Fair, MD  Sent: 04/09/2017  3:22 PM  To: Darrol Jump, PA-C  The neprilysin component should really not raise her potassium. It is puzzling that her potassium should have gone up. Can we just make sure she is not taking a potassium supplement and recheck in 1 week, please?  MCr  ------ Message -----  From: Jacinta Shoe  Sent: 04/09/2017  2:14 PM  To: Thurmon Fair, MD, Darrol Jump, PA-C  Her K+ went up on Entresto, 3.9>>5.4  Can she take 1/2 tablet or stop completely?  She did not have a problem on losartan by itself.

## 2017-04-12 NOTE — Telephone Encounter (Signed)
Pt notified to stop the bananas, raisins, grapes and other high K+ foods she will look and see if she is eating any and stop those too.

## 2017-04-12 NOTE — Telephone Encounter (Signed)
Called pt and notified of messages above, she will re-start the [facility] and repeat labwork in 2 weeks. Pt denies that she is taking any K+ supplements but she has been eating bananas, raisins and grapes.

## 2017-04-12 NOTE — Telephone Encounter (Signed)
It's best to curtail those high K foods until we clarify the situation please. MCr

## 2017-04-26 ENCOUNTER — Other Ambulatory Visit: Payer: Self-pay

## 2017-04-26 DIAGNOSIS — I5021 Acute systolic (congestive) heart failure: Secondary | ICD-10-CM

## 2017-04-26 LAB — BASIC METABOLIC PANEL
BUN/Creatinine Ratio: 20 (ref 12–28)
BUN: 19 mg/dL (ref 8–27)
CALCIUM: 9.5 mg/dL (ref 8.7–10.3)
CO2: 27 mmol/L (ref 20–29)
CREATININE: 0.95 mg/dL (ref 0.57–1.00)
Chloride: 102 mmol/L (ref 96–106)
GFR calc Af Amer: 72 mL/min/{1.73_m2} (ref >59)
GFR calc non Af Amer: 62 mL/min/{1.73_m2} (ref >59)
GLUCOSE: 89 mg/dL (ref 65–99)
Potassium: 5 mmol/L (ref 3.5–5.2)
Sodium: 142 mmol/L (ref 134–144)

## 2017-05-17 ENCOUNTER — Telehealth: Payer: Self-pay | Admitting: *Deleted

## 2017-05-17 NOTE — Telephone Encounter (Signed)
Research Telephone Encounter  Subject meets inclusion/exclusion criteria for Galactic Heart Failure Research Study.  Dr. Royann Shivers and Dr. Shirlee Latch aware of potential screening.  Galactic Study discussed with the subject on the phone.  Consent emailed, by request, for subject to review.  All questions/concerns answered.

## 2017-05-19 ENCOUNTER — Telehealth: Payer: Self-pay | Admitting: *Deleted

## 2017-05-19 NOTE — Telephone Encounter (Signed)
Received patient as walk in requesting samples:  Medication samples have been provided to the patient.  Drug name: Charlynne Pander  Qty: 14 days  LOT: [id number]  Exp.Date: 09/20

## 2017-05-26 ENCOUNTER — Encounter: Payer: Medicare Other | Admitting: *Deleted

## 2017-05-26 VITALS — BP 167/86 | HR 80 | Ht 66.0 in | Wt 183.6 lb

## 2017-05-26 DIAGNOSIS — Z006 Encounter for examination for normal comparison and control in clinical research program: Secondary | ICD-10-CM

## 2017-05-26 NOTE — Progress Notes (Addendum)
GalacticInformed Consent  Subject Name: Denise Anderson  The informed consent form, study requirements and expectations were reviewed with the subject and questions and concerns were addressed prior to the signing of the consent form. The subject verbalized understanding of the trail requirements. The subject agreed to participate in the Galactictrial and signed the informed consent. The informed consent was obtained prior to performance of any protocol-specific procedures for the subject. A copy of the signed informed consent was given to the subject.  Subject completed screening procedures.  At this time, the subject is not eligible based on exclusion criteria Protocol Code 216.  Subject BP greater than 140 systolic after multiple attempts with resting periods.  Subject stated that she does not measure her BP at home and has not been compliant with her diet over the weekend.    Education provided on heart failure, following a low sodium diet, and monitoring BP.  In addition, subject educated on seeking medical attention if weight gain of 2-3 lbs in a day and/or elevated BP readings.  Subject, and daughter at side, verbalized understanding.    Subject willing to re-screen on Monday, December 17th.     Beryle Lathe, RN 05/26/2017

## 2017-05-31 ENCOUNTER — Encounter: Payer: Medicare Other | Admitting: *Deleted

## 2017-05-31 VITALS — BP 138/79 | HR 76 | Ht 66.0 in | Wt 180.4 lb

## 2017-05-31 DIAGNOSIS — Z006 Encounter for examination for normal comparison and control in clinical research program: Secondary | ICD-10-CM

## 2017-05-31 NOTE — Progress Notes (Signed)
GalacticInformed Consent  Subject Name: Denise Anderson  Subject met inclusion and exclusion criteria. The informed consent form, study requirements and expectations were reviewed with the subject and questions and concerns were addressed prior to the signing of the consent form. The subject verbalized understanding of the trail requirements. The subject agreed to participate in the Galactictrial and signed the informed consent. The informed consent was obtained prior to performance of any protocol-specific procedures for the subject. A copy of the signed informed consent was given to the subject.   Subject's BP within Galactic trial range.  Labs collected and awaiting results.  Will contact subject via phone once labs received and reviewed.    Duncan Dull, RN 05/31/2017

## 2017-06-02 ENCOUNTER — Telehealth: Payer: Self-pay | Admitting: *Deleted

## 2017-06-02 NOTE — Telephone Encounter (Signed)
RESEARCH ENCOUNTER  Patient ID: Denise Anderson  DOB: July 02, 1950  Denise Anderson  does not qualify for Galactic Heart Failure based on protocol 108 exclusion criteria.  Subject's ProBNP 117pg/mL  Contacted subject via phone.  Subject and daughter verbalized understanding.  Labs will be scanned into chart.

## 2017-06-07 ENCOUNTER — Telehealth: Payer: Self-pay | Admitting: Cardiovascular Disease

## 2017-06-07 NOTE — Telephone Encounter (Signed)
Patient made aware that samples are available at the front and that the office is closing at 12 today and then closed tomorrow.   Medication Samples have been provided to the patient.  Drug name: Sherryll Burger       Strength: 24-26        Qty: One box  LOT: [id number]  Exp.Date: 6/21

## 2017-06-07 NOTE — Telephone Encounter (Signed)
New Message     Patient calling the office for samples of medication:   1.  What medication and dosage are you requesting samples for? entresto 26mg   2.  Are you currently out of this medication?  Yes

## 2017-06-18 ENCOUNTER — Other Ambulatory Visit: Payer: Self-pay

## 2017-06-18 ENCOUNTER — Ambulatory Visit (HOSPITAL_COMMUNITY): Payer: Medicare HMO | Attending: Cardiovascular Disease

## 2017-06-18 DIAGNOSIS — I428 Other cardiomyopathies: Secondary | ICD-10-CM | POA: Diagnosis not present

## 2017-06-18 DIAGNOSIS — I10 Essential (primary) hypertension: Secondary | ICD-10-CM | POA: Diagnosis not present

## 2017-06-21 ENCOUNTER — Telehealth: Payer: Self-pay | Admitting: Physician Assistant

## 2017-06-21 NOTE — Telephone Encounter (Signed)
Medication samples have been provided to the patient.  Drug name: entresto 57/26  Qty: 2 bottles = 2 weeks  LOT: [id number]  Exp.Date: 11/2019  Samples left at front desk for patient pick-up. Patient notified.  Julaine Fusi M 2:04 PM 06/21/2017

## 2017-06-21 NOTE — Telephone Encounter (Signed)
New message  ° °Patient calling the office for samples of medication: ° ° °1.  What medication and dosage are you requesting samples for?sacubitril-valsartan (ENTRESTO) 24-26 MG ° °2.  Are you currently out of this medication?  Yes  ° ° °

## 2017-07-05 ENCOUNTER — Telehealth: Payer: Self-pay | Admitting: Cardiovascular Disease

## 2017-07-05 NOTE — Telephone Encounter (Signed)
New message ° ° ° °Patient calling the office for samples of medication: ° ° °1.  What medication and dosage are you requesting samples for?sacubitril-valsartan (ENTRESTO) 24-26 MG ° °2.  Are you currently out of this medication? yes ° °

## 2017-07-05 NOTE — Telephone Encounter (Signed)
Advised patient 1 box of Entrest 24/26 mg at front desk for pick up lot # [id number] exp 6/21

## 2017-07-08 ENCOUNTER — Ambulatory Visit: Payer: Medicare HMO | Admitting: Cardiovascular Disease

## 2017-07-08 ENCOUNTER — Encounter: Payer: Self-pay | Admitting: Cardiovascular Disease

## 2017-07-08 ENCOUNTER — Encounter (INDEPENDENT_AMBULATORY_CARE_PROVIDER_SITE_OTHER): Payer: Self-pay

## 2017-07-08 VITALS — BP 140/82 | HR 80 | Ht 65.5 in | Wt 183.8 lb

## 2017-07-08 DIAGNOSIS — I5042 Chronic combined systolic (congestive) and diastolic (congestive) heart failure: Secondary | ICD-10-CM | POA: Diagnosis not present

## 2017-07-08 DIAGNOSIS — I10 Essential (primary) hypertension: Secondary | ICD-10-CM | POA: Diagnosis not present

## 2017-07-08 MED ORDER — CARVEDILOL 12.5 MG PO TABS: 12.5000 mg | ORAL_TABLET | Freq: Two times a day (BID) | ORAL | 3 refills | Status: DC

## 2017-07-08 NOTE — Progress Notes (Signed)
Cardiology Office Note:    Date:  07/08/2017   ID:  Jennell Corner, DOB 1950/12/30, MRN [id number]  PCP:  Shirlean Mylar, MD  Cardiologist:  Thurmon Fair, MD   Referring MD: Shirlean Mylar, MD   Chief Complaint  Patient presents with  . 3 month f/u visit    pt states no new Sx.  CHF  History of Present Illness:    Denise Anderson is a 67 y.o. 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 She has NYHA functional class I. She exercises regularly and can do 25 jumping jacks without stopping she has not required furosemide on a regular basis in a few months. She did need to take some after Thanksgiving and after Christmas respectively. She has not had problems with orthopnea, PND, edema, chest discomfort, syncope or palpitations. She complains of numbness in the tips of her fingers of both hands when she wakes up in the morning, resolving spontaneously. She also complains about the high cost of Entresto.  D echocardiogram performed earlier this month showed a remarkable improvement in left ventricular ejection fraction, now 50-55%, diastolic parameters consistent with abnormal relaxation. The left atrium was mildly dilated.  Past Medical History:  Diagnosis Date  . CHF (congestive heart failure), NYHA class II, acute, combined (HCC) 02/15/2017  . Hypertension   . Leukopenia   . Shingles     Past Surgical History:  Procedure Laterality Date  . TUBAL LIGATION      Current Medications: Current Meds  Medication Sig  . aspirin EC 81 MG EC tablet Take 1 tablet (81 mg total) by mouth daily.  . carvedilol (COREG) 6.25 MG tablet Take 1.5 tablets (9.375 mg total) by mouth 2 (two) times daily with a meal.  . furosemide (LASIX) 20 MG tablet Take one tablet-po PRN forSOB AND WT GAIN OF >3#qd/>5#WK  . Garlic 2000 MG CAPS Take 4,000 mg  by mouth daily.  Marland Kitchen OVER THE COUNTER MEDICATION Take 1 capsule by mouth 2 (two) times daily. [place] Capsule  . sacubitril-valsartan (ENTRESTO) 24-26 MG Take 1 tablet by mouth 2 (two) times daily.     Allergies:   Patient has no known allergies.   Social History   Socioeconomic History  . Marital status: Divorced    Spouse name: None  . Number of children: None  . Years of education: None  . Highest education level: None  Social Needs  . Financial resource strain: None  . Food insecurity - worry: None  . Food insecurity - inability: None  . Transportation needs - medical: None  . Transportation needs - non-medical: None  Occupational History  . Occupation: Psychologist, forensic  Tobacco Use  . Smoking status: Never Smoker  . Smokeless tobacco: Never Used  Substance and Sexual Activity  . Alcohol use: No  . Drug use: No  . Sexual activity: None  Other Topics Concern  . None  Social History Narrative  . None     Family History: The patient's family history includes CAD in her father and mother; CVA in her brother; Valvular heart disease in her mother.  ROS:   Please see the history of present illness.     All other systems reviewed and are negative.  EKGs/Labs/Other Studies Reviewed:    The following studies were reviewed today: Echo Jun 18, 2017  EKG:  EKG is not ordered today.  Recent Labs: 02/15/2017: B Natriuretic Peptide 2,748.8; Hemoglobin 12.6; Platelets 222; TSH 0.453 04/26/2017: BUN 19; Creatinine, Ser 0.95; Potassium 5.0; Sodium 142  Recent Lipid Panel    Component Value Date/Time   CHOL 157 02/17/2017 0307   TRIG 114 02/17/2017 0307   HDL 53 02/17/2017 0307   CHOLHDL 3.0 02/17/2017 0307   VLDL 23 02/17/2017 0307   LDLCALC 81 02/17/2017 0307    Physical Exam:    VS:  BP 140/82 (BP Location: Left Arm, Patient Position: Sitting, Cuff Size: Normal)   Pulse 80   Ht 5' 5.5" (1.664 m)   Wt 183 lb 12.8 oz (83.4 kg)   BMI 30.12 kg/m     Wt Readings  from Last 3 Encounters:  07/08/17 183 lb 12.8 oz (83.4 kg)  05/31/17 180 lb 6.4 oz (81.8 kg)  05/26/17 183 lb 9.6 oz (83.3 kg)     GEN: Borderline obese, Well nourished, well developed in no acute distress HEENT: Normal NECK: No JVD; No carotid bruits LYMPHATICS: No lymphadenopathy CARDIAC: RRR, no murmurs, rubs, gallops RESPIRATORY:  Clear to auscultation without rales, wheezing or rhonchi  ABDOMEN: Soft, non-tender, non-distended MUSCULOSKELETAL:  No edema; No deformity  SKIN: Warm and dry NEUROLOGIC:  Alert and oriented x 3 PSYCHIATRIC:  Normal affect   ASSESSMENT:    No diagnosis found. PLAN:    In order of problems listed above:  1. CHF: Clinically euvolemic, NYHA functional class I, not requiring diuretics on a regular basis. On Entresto (gave her a co-pay card) and carvedilol. Blood pressure remains borderline high. We'll increase carvedilol to 12.5 mg twice daily since it's inconvenient to cut the pills in half. I asked her to send me some blood pressure recordings in a couple of weeks through [email or web address]. She does not need a defibrillator. Avoid spironolactone due to tendency to hyperkalemia. 2. HTN: Increase carvedilol   Medication Adjustments/Labs and Tests Ordered: Current medicines are reviewed at length with the patient today.  Concerns regarding medicines are outlined above.  No orders of the defined types were placed in this encounter.  No orders of the defined types were placed in this encounter.   Signed, Thurmon Fair, MD  07/08/2017 8:45 AM    [facility]

## 2017-07-08 NOTE — Patient Instructions (Signed)
Dr Royann Shivers has recommended making the following medication changes: 1. INCREASE Carvedilol to 12.5 mg twice daily  Your physician recommends that you schedule a follow-up appointment in 3 months.  If you need a refill on your cardiac medications before your next appointment, please call your pharmacy.

## 2017-07-12 DIAGNOSIS — R69 Illness, unspecified: Secondary | ICD-10-CM | POA: Diagnosis not present

## 2017-07-21 ENCOUNTER — Telehealth: Payer: Self-pay | Admitting: Physician Assistant

## 2017-07-21 NOTE — Telephone Encounter (Signed)
New Message ° ° °Patient calling the office for samples of medication: ° ° °1.  What medication and dosage are you requesting samples for? sacubitril-valsartan (ENTRESTO) 24-26 MG ° °2.  Are you currently out of this medication? yes ° ° °

## 2017-07-21 NOTE — Telephone Encounter (Signed)
Patient made aware that samples are available at the front:  Medication Samples have been provided to the patient.  Drug name: Sherryll Burger       Strength: 24mg /26mg         Qty: 2 bottles  LOT: [id number]  Exp.Date: 9/20

## 2017-08-05 ENCOUNTER — Telehealth: Payer: Self-pay | Admitting: Cardiovascular Disease

## 2017-08-05 NOTE — Telephone Encounter (Signed)
Patient calling the office for samples of medication:   1.  What medication and dosage are you requesting samples for?Entresto  2.  Are you currently out of this medication? yes    

## 2017-08-05 NOTE — Telephone Encounter (Signed)
Medication samples have been provided to the patient.  Drug name: Sherryll Burger 57/26  Qty: 1 box    LOT: [id number]  Exp.Date: 09/20  Samples left at front desk for patient pick-up. Patient notified.   Patient states she had co-pay card and they will not allow her to use it but she did use 30 day card.      Advised I would route to Dr. Royann Shivers primary to see if there is any options to try to help with cost.

## 2017-09-03 ENCOUNTER — Telehealth: Payer: Self-pay | Admitting: Cardiovascular Disease

## 2017-09-03 ENCOUNTER — Telehealth: Payer: Self-pay | Admitting: Physician Assistant

## 2017-09-03 NOTE — Telephone Encounter (Signed)
Patient calling the office for samples of medication:   1.  What medication and dosage are you requesting samples for?Entresto 2.  Are you currently out of this medication?  A few

## 2017-09-03 NOTE — Telephone Encounter (Signed)
Received a fax this afternoon with patient appointment scheduled with Dr. Evette Cristal on 09-06-17 at 10:15 a.m.

## 2017-09-03 NOTE — Telephone Encounter (Signed)
Medication samples have been provided to the patient.   Drug name: Sherryll Burger       Strength: 24-26        Qty: 1  boxes    LOT: [id number] Exp.Date: 6-21   Pt notified samples at the front desk

## 2017-09-23 NOTE — Telephone Encounter (Signed)
Patient assistance application completed on 09/03/17.

## 2017-10-15 ENCOUNTER — Encounter: Payer: Self-pay | Admitting: Cardiovascular Disease

## 2017-10-15 ENCOUNTER — Ambulatory Visit: Payer: Medicare HMO | Admitting: Cardiovascular Disease

## 2017-10-15 ENCOUNTER — Telehealth: Payer: Self-pay | Admitting: Cardiovascular Disease

## 2017-10-15 VITALS — BP 158/84 | HR 71 | Ht 65.0 in | Wt 187.4 lb

## 2017-10-15 DIAGNOSIS — I5042 Chronic combined systolic (congestive) and diastolic (congestive) heart failure: Secondary | ICD-10-CM

## 2017-10-15 DIAGNOSIS — I10 Essential (primary) hypertension: Secondary | ICD-10-CM | POA: Diagnosis not present

## 2017-10-15 MED ORDER — SACUBITRIL-VALSARTAN 49-51 MG PO TABS: 1.0000 | ORAL_TABLET | Freq: Two times a day (BID) | ORAL | 0 refills | Status: DC

## 2017-10-15 NOTE — Telephone Encounter (Signed)
New Message    *STAT* If patient is at the pharmacy, call can be transferred to refill team.   1. Which medications need to be refilled? (please list name of each medication and dose if known) Entresto 49-51mg   2. Which pharmacy/location (including street and city if local pharmacy) is medication to be sent to? [facility]   3. Do they need a 30 day or 90 day supply? 30

## 2017-10-15 NOTE — Patient Instructions (Signed)
Dr Royann Shivers has recommended making the following medication changes: 1. INCREASE Entresto to 49-51 mg twice daily  Your physician recommends that you schedule a follow-up appointment in 3-4 weeks with our clinical pharmacist for a blood pressure check/medication management.  Dr Royann Shivers recommends that you schedule a follow-up appointment in 12 months. You will receive a reminder letter in the mail two months in advance. If you don't receive a letter, please call our office to schedule the follow-up appointment.  If you need a refill on your cardiac medications before your next appointment, please call your pharmacy.

## 2017-10-15 NOTE — Progress Notes (Signed)
Cardiology Office Note:    Date:  10/15/2017   ID:  Jennell Corner, DOB October 02, 1950, MRN [id number]  PCP:  Shirlean Mylar, MD  Cardiologist:  Thurmon Fair, MD   Referring MD: Shirlean Mylar, MD   No chief complaint on file. CHF  History of Present Illness:    Denise Anderson is a 67 y.o. 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 She has NYHA functional class I. Follow up echocardiogram January 2019 showed a remarkable improvement in left ventricular ejection fraction, now 50-55%, diastolic parameters consistent with abnormal relaxation. The left atrium was mildly dilated.  The patient specifically denies any chest pain at rest exertion, dyspnea at rest or with exertion, orthopnea, paroxysmal nocturnal dyspnea, syncope, palpitations, focal neurological deficits, intermittent claudication, lower extremity edema, unexplained weight gain, cough, hemoptysis or wheezing.  She has not required any diuretic in several weeks.  She is no longer exercising regularly, but not because she is feeling bad, simply because she has "become lazy".  Her blood pressure is elevated.  Past Medical History:  Diagnosis Date  . CHF (congestive heart failure), NYHA class II, acute, combined (HCC) 02/15/2017  . Hypertension   . Leukopenia   . Shingles     Past Surgical History:  Procedure Laterality Date  . TUBAL LIGATION      Current Medications: Current Meds  Medication Sig  . aspirin EC 81 MG EC tablet Take 1 tablet (81 mg total) by mouth daily.  . carvedilol (COREG) 12.5 MG tablet Take 1 tablet (12.5 mg total) by mouth 2 (two) times daily with a meal.  . furosemide (LASIX) 20 MG tablet Take one tablet-po PRN forSOB AND WT GAIN OF >3#qd/>5#WK  . Garlic 2000 MG CAPS Take 4,000 mg by mouth daily.  Marland Kitchen OVER THE COUNTER MEDICATION Take 1 capsule  by mouth 2 (two) times daily. [place] Capsule  . [DISCONTINUED] sacubitril-valsartan (ENTRESTO) 24-26 MG Take 1 tablet by mouth 2 (two) times daily.     Allergies:   Patient has no known allergies.   Social History   Socioeconomic History  . Marital status: Divorced    Spouse name: Not on file  . Number of children: Not on file  . Years of education: Not on file  . Highest education level: Not on file  Occupational History  . Occupation: Psychologist, forensic  Social Needs  . Financial resource strain: Not on file  . Food insecurity:    Worry: Not on file    Inability: Not on file  . Transportation needs:    Medical: Not on file    Non-medical: Not on file  Tobacco Use  . Smoking status: Never Smoker  . Smokeless tobacco: Never Used  Substance and Sexual Activity  . Alcohol use: No  . Drug use: No  . Sexual activity: Not on file  Lifestyle  . Physical activity:    Days per week: Not on file    Minutes per session: Not on file  . Stress: Not on file  Relationships  . Social connections:    Talks on phone: Not on file    Gets together: Not on file    Attends religious service: Not on file    Active member of club or organization: Not on file    Attends meetings of clubs or organizations: Not on file    Relationship status: Not  on file  Other Topics Concern  . Not on file  Social History Narrative  . Not on file     Family History: The patient's family history includes CAD in her father and mother; CVA in her brother; Valvular heart disease in her mother.  ROS:   Please see the history of present illness.     All other systems reviewed and are negative.  EKGs/Labs/Other Studies Reviewed:    The following studies were reviewed today: Echo Jun 18, 2017  EKG:  EKG is ordered today.  Shows normal sinus rhythm with a narrow QRS, borderline criteria for LVH by voltage, T wave inversion in leads V3-V6 as well as in leads I, II, III, aVF, likely secondary to LVH.  The 443  ms.  No significant change from previous tracings  Recent Labs: 02/15/2017: B Natriuretic Peptide 2,748.8; Hemoglobin 12.6; Platelets 222; TSH 0.453 04/26/2017: BUN 19; Creatinine, Ser 0.95; Potassium 5.0; Sodium 142  Recent Lipid Panel    Component Value Date/Time   CHOL 157 02/17/2017 0307   TRIG 114 02/17/2017 0307   HDL 53 02/17/2017 0307   CHOLHDL 3.0 02/17/2017 0307   VLDL 23 02/17/2017 0307   LDLCALC 81 02/17/2017 0307    Physical Exam:    VS:  BP (!) 158/84   Pulse 71   Ht 5\' 5"  (1.651 m)   Wt 187 lb 6.4 oz (85 kg)   BMI 31.18 kg/m     Wt Readings from Last 3 Encounters:  10/15/17 187 lb 6.4 oz (85 kg)  07/08/17 183 lb 12.8 oz (83.4 kg)  05/31/17 180 lb 6.4 oz (81.8 kg)     General: Alert, oriented x3, no distress, mildly obese Head: no evidence of trauma, PERRL, EOMI, no exophtalmos or lid lag, no myxedema, no xanthelasma; normal ears, nose and oropharynx Neck: normal jugular venous pulsations and no hepatojugular reflux; brisk carotid pulses without delay and no carotid bruits Chest: clear to auscultation, no signs of consolidation by percussion or palpation, normal fremitus, symmetrical and full respiratory excursions Cardiovascular: normal position and quality of the apical impulse, regular rhythm, normal first and second heart sounds, no murmurs, rubs or gallops Abdomen: no tenderness or distention, no masses by palpation, no abnormal pulsatility or arterial bruits, normal bowel sounds, no hepatosplenomegaly Extremities: no clubbing, cyanosis or edema; 2+ radial, ulnar and brachial pulses bilaterally; 2+ right femoral, posterior tibial and dorsalis pedis pulses; 2+ left femoral, posterior tibial and dorsalis pedis pulses; no subclavian or femoral bruits Neurological: grossly nonfocal Psych: Normal mood and affect   ASSESSMENT:    1. Chronic combined systolic and diastolic heart failure (HCC)   2. Essential hypertension    PLAN:    In order of problems  listed above:  1. CHF: Her blood pressure remains elevated and she will benefit from further titration of Entresto.  She is on a reasonably high dose of beta-blocker, but carvedilol can be increased further as well since her heart rate is still greater than 70.  She does not need a defibrillator. Avoid spironolactone due to tendency to hyperkalemia.  We will bring her back in about a month to see our clinical pharmacist to see if there is room to titrate up either the Entresto or the carvedilol. 2. HTN: Increase Entresto.   Medication Adjustments/Labs and Tests Ordered: Current medicines are reviewed at length with the patient today.  Concerns regarding medicines are outlined above.  No orders of the defined types were placed in this encounter.  No orders of the defined types were placed in this encounter.  Patient Instructions  Dr Royann Shivers has recommended making the following medication changes: 1. INCREASE Entresto to 49-51 mg twice daily  Your physician recommends that you schedule a follow-up appointment in 3-4 weeks with our clinical pharmacist for a blood pressure check/medication management.  Dr Royann Shivers recommends that you schedule a follow-up appointment in 12 months. You will receive a reminder letter in the mail two months in advance. If you don't receive a letter, please call our office to schedule the follow-up appointment.  If you need a refill on your cardiac medications before your next appointment, please call your pharmacy.    Signed, Thurmon Fair, MD  10/15/2017 7:34 PM    [facility]

## 2017-11-04 ENCOUNTER — Ambulatory Visit: Payer: Medicare HMO | Admitting: Pharmacist Clinician (PhC)/ Clinical Pharmacy Specialist

## 2017-11-04 DIAGNOSIS — I5042 Chronic combined systolic (congestive) and diastolic (congestive) heart failure: Secondary | ICD-10-CM

## 2017-11-04 MED ORDER — SACUBITRIL-VALSARTAN 97-103 MG PO TABS: 1.0000 | ORAL_TABLET | Freq: Two times a day (BID) | ORAL | 5 refills | Status: DC

## 2017-11-04 NOTE — Progress Notes (Signed)
Thank you :)

## 2017-11-04 NOTE — Patient Instructions (Addendum)
Increase Entresto to 97/103 mg (can take 2 of the 49/51 mg tablets twice daily until they are gone).  Continue with your low sodium diet and exercise programs  Your blood pressure today is 162/94  (goal is around 130/90)  Check your blood pressure at home daily and keep record of the readings.  Bring all of your meds, your BP cuff and your record of home blood pressures to your next appointment.  Exercise as you're able, try to walk approximately 30 minutes per day.  Keep salt intake to a minimum, especially watch canned and prepared boxed foods.  Eat more fresh fruits and vegetables and fewer canned items.  Avoid eating in fast food restaurants.    HOW TO TAKE YOUR BLOOD PRESSURE: . Rest 5 minutes before taking your blood pressure. .  Don't smoke or drink caffeinated beverages for at least 30 minutes before. . Take your blood pressure before (not after) you eat. . Sit comfortably with your back supported and both feet on the floor (don't cross your legs). . Elevate your arm to heart level on a table or a desk. . Use the proper sized cuff. It should fit smoothly and snugly around your bare upper arm. There should be enough room to slip a fingertip under the cuff. The bottom edge of the cuff should be 1 inch above the crease of the elbow. . Ideally, take 3 measurements at one sitting and record the average.

## 2017-11-04 NOTE — Progress Notes (Signed)
11/04/2017 Denise Anderson 07/11/1950 [id number]   HPI:  Denise Anderson is a 67 y.o. female patient of Dr Royann Shivers, with a PMH below who presents today for heart failure medication titration.  In addition to chronic combined systolic/diastolic heart failure her medical history is significant for hypertension and leukopenia.  An echocardiogram in September showed an EF of 15-20%. At that time losartan was discontinued and Entresto 24/26 mg started.  A blood pressure check by the nursing staff about 3 weeks later showed her pressure to be at 106/70.  A lab drawn the same day showed an elevation in her potassium to 5.4.  Sherryll Burger was held for just a few weeks then restarted.   Repeated BMET labs show potassium now stable.  She saw Dr. Royann Shivers for follow up on May 3, and because of elevated blood pressure, the dose was increased to 49/51 mg twice daily.  She is here today for follow up to see if we can further titrate to maximum dose.    Patient reports feeling well today.  No complaints about her medications.  Blood Pressure Goal:  130/80  Current Medications:  Entresto 49/51 mg twice daily  Carvedilol 12.5 mg twice daily   Cardiac Hx:  Family Hx:  Mother had couple of strokes, valve problems, died from cardiac issues (14's)  Father no heart disease (late 58's)  1 brother with [place] vet, multiple strokes (Agent Orange exposure)  2 children, no health issues as of yet  Social Hx:  No tobacco; no alcohol (only rarely); coffee is decaf, does drink cola 1-2 times per week (12 oz can)  Diet:  Oatmeal for breakfast most days; does like hot dogs (overboils to cut salt); mostly eats chicken, not much pork or red meats; occasional fish; vegetables are mostly frozen; not too much snack foods;   Exercise: doing jumping jacks 75 at a time, trying to do 3 times per day (currently once per day)  Home BP readings:  Home readings as high as 190's last week, this week to the 140's.  (tried to eat less  process food)  Intolerances:  nkda   Wt Readings from Last 3 Encounters:  10/15/17 187 lb 6.4 oz (85 kg)  07/08/17 183 lb 12.8 oz (83.4 kg)  05/31/17 180 lb 6.4 oz (81.8 kg)   BP Readings from Last 3 Encounters:  11/04/17 (!) 162/94  10/15/17 (!) 158/84  07/08/17 140/82   Pulse Readings from Last 3 Encounters:  11/04/17 60  10/15/17 71  07/08/17 80    Current Outpatient Medications  Medication Sig Dispense Refill  . aspirin EC 81 MG EC tablet Take 1 tablet (81 mg total) by mouth daily. 30 tablet 6  . carvedilol (COREG) 12.5 MG tablet Take 1 tablet (12.5 mg total) by mouth 2 (two) times daily with a meal. 180 tablet 3  . furosemide (LASIX) 20 MG tablet Take one tablet-po PRN forSOB AND WT GAIN OF >3#qd/>5#WK 30 tablet 2  . Garlic 2000 MG CAPS Take 4,000 mg by mouth daily.    Marland Kitchen OVER THE COUNTER MEDICATION Take 1 capsule by mouth 2 (two) times daily. [place] Capsule    . sacubitril-valsartan (ENTRESTO) 97-103 MG Take 1 tablet by mouth 2 (two) times daily. 60 tablet 5   No current facility-administered medications for this visit.     No Known Allergies  Past Medical History:  Diagnosis Date  . CHF (congestive heart failure), NYHA class II, acute, combined (HCC) 02/15/2017  . Hypertension   .  Leukopenia   . Shingles     Blood pressure (!) 162/94, pulse 60.  Chronic combined systolic and diastolic heart failure (HCC) Patient doing well on current doses of Entresto and carvedilol.  Blood pressure remains high, so will increase the Entresto to 97/103 mg.  She will return in 3-4 weeks.  Before then she is to take her home BP once daily and was given instructions on proper technique.  She will bring her cuff, along with the readings to her next appointment.  At that time we can better determine her hypertension levels and further titrate carvedilol should that be necessary.  She was encouraged to continue with her exercise and sodium restrictions.  We will draw a BMET at her next  visit, after the dose increase, to be sure electrolytes and renal function are normal.    Phillips Hay PharmD CPP [facility] [address] [place], [place] [address] [phone]

## 2017-11-04 NOTE — Assessment & Plan Note (Addendum)
Patient doing well on current doses of Entresto and carvedilol.  Blood pressure remains high, so will increase the Entresto to 97/103 mg.  She will return in 3-4 weeks.  Before then she is to take her home BP once daily and was given instructions on proper technique.  She will bring her cuff, along with the readings to her next appointment.  At that time we can better determine her hypertension levels and further titrate carvedilol should that be necessary.  She was encouraged to continue with her exercise and sodium restrictions.  We will draw a BMET at her next visit, after the dose increase, to be sure electrolytes and renal function are normal.

## 2017-11-25 ENCOUNTER — Ambulatory Visit: Payer: Medicare HMO

## 2017-11-30 DIAGNOSIS — K5730 Diverticulosis of large intestine without perforation or abscess without bleeding: Secondary | ICD-10-CM | POA: Diagnosis not present

## 2017-11-30 DIAGNOSIS — Z86010 Personal history of colonic polyps: Secondary | ICD-10-CM | POA: Diagnosis not present

## 2017-12-02 ENCOUNTER — Encounter: Payer: Self-pay | Admitting: Pharmacist

## 2017-12-02 ENCOUNTER — Ambulatory Visit (INDEPENDENT_AMBULATORY_CARE_PROVIDER_SITE_OTHER): Payer: Medicare HMO | Admitting: Pharmacist

## 2017-12-02 VITALS — BP 160/84 | HR 65 | Ht 66.0 in | Wt 191.0 lb

## 2017-12-02 DIAGNOSIS — I5042 Chronic combined systolic (congestive) and diastolic (congestive) heart failure: Secondary | ICD-10-CM | POA: Diagnosis not present

## 2017-12-02 LAB — BASIC METABOLIC PANEL
BUN/Creatinine Ratio: 15 (ref 12–28)
BUN: 14 mg/dL (ref 8–27)
CALCIUM: 9.3 mg/dL (ref 8.7–10.3)
CHLORIDE: 105 mmol/L (ref 96–106)
CO2: 25 mmol/L (ref 20–29)
Creatinine, Ser: 0.94 mg/dL (ref 0.57–1.00)
GFR calc non Af Amer: 63 mL/min/{1.73_m2} (ref >59)
GFR, EST AFRICAN AMERICAN: 73 mL/min/{1.73_m2} (ref >59)
Glucose: 89 mg/dL (ref 65–99)
POTASSIUM: 4.1 mmol/L (ref 3.5–5.2)
Sodium: 144 mmol/L (ref 134–144)

## 2017-12-02 MED ORDER — CARVEDILOL 25 MG PO TABS: 25.0000 mg | ORAL_TABLET | Freq: Two times a day (BID) | ORAL | 1 refills | Status: DC

## 2017-12-02 NOTE — Progress Notes (Signed)
HPI:  Denise Anderson is a 67 y.o. female patient of Dr Royann Shivers, with a PMH below who presents today for heart failure medication titration.  In addition to chronic combined systolic/diastolic heart failure her medical history is significant for hypertension and leukopenia.  An echocardiogram in September showed an EF of 15-20%. At that time losartan was discontinued and Entresto 24/26 mg started.  During follow up with DR Croitoru on 10/15/2017 Entresto dose was increased to 49/51mg  twice daily and further titrated to 97/103mg  twice daily on 11/04/17 by clinical pharmacist.  Patient presents today for assessment after recent titration.  She reports having [place] food yesterday and not watching her sodium intake at all. Noted about 3 pound weight gain in the last 48 hours but denied shortness of breath or taking furosemide.   Blood Pressure Goal:  130/80  Current Medications:  Entresto 97/103 mg twice daily  Carvedilol 12.5 mg twice daily   Family History:  Mother had couple of strokes, valve problems, died from cardiac issues (38's)  Father no heart disease (late 71's)  1 brother with [place] vet, multiple strokes (Agent Orange exposure)  2 children, no health issues as of yet  Social History:  No tobacco; no alcohol (only rarely); coffee is decaf, does drink cola 1-2 times per week (12 oz can)  Diet:  Oatmeal for breakfast most days; does like hot dogs (overboils to cut salt); mostly eats chicken, not much pork or red meats; occasional fish; vegetables are mostly frozen; not too much snack foods;   Exercise: doing jumping jacks 75 at a time, trying to do 3 times per day (currently once per day)  Home BP readings:  17 readings; average 129/90 (HR 61-78bpm) *Home monitor equate (arm cuff) - accurate within  when using appropriate technique    Intolerances:  nkda  Wt Readings from Last 3 Encounters:  12/02/17 191 lb (86.6 kg)  10/15/17 187 lb 6.4 oz (85 kg)  07/08/17 183 lb 12.8 oz (83.4 kg)   BP Readings from Last 3 Encounters:  12/02/17 (!) 160/84  11/04/17 (!) 162/94  10/15/17 (!) 158/84   Pulse Readings from Last 3 Encounters:  12/02/17 65  11/04/17 60  10/15/17 71    Current Outpatient Medications  Medication Sig Dispense Refill  . aspirin EC 81 MG EC tablet Take 1 tablet (81 mg total) by mouth daily. 30 tablet 6  . carvedilol (COREG) 25 MG tablet Take 1 tablet (25 mg total) by mouth 2 (two) times daily with a meal. Note dose change 60 tablet 1  . furosemide (LASIX) 20 MG tablet Take one tablet-po PRN forSOB AND WT GAIN OF >3#qd/>5#WK 30 tablet 2  . Garlic 2000 MG CAPS Take 4,000 mg by mouth daily.    Marland Kitchen OVER THE COUNTER MEDICATION Take 1 capsule by mouth 2 (two) times daily. [place] Capsule    . sacubitril-valsartan (ENTRESTO) 97-103 MG Take 1 tablet by mouth 2 (two) times daily. 60 tablet 5   No current facility-administered medications for this visit.     No Known Allergies  Past Medical History:  Diagnosis Date  . CHF (congestive heart failure), NYHA class II, acute, combined (HCC) 02/15/2017  . Hypertension   . Leukopenia   . Shingles     Blood pressure (!) 160/84, pulse 65, height 5\' 6"  (1.676 m), weight 191 lb (86.6 kg), SpO2 97 %.  Chronic combined systolic and diastolic heart failure (HCC) Blood pressure remains elevated this morning. Patient is not controlling  her sodium intake or taking furosemide. No morning medication today either. Will continue Entresto at maximum dose of 97/103mg  twice daily and increase carvedilol to 25mg  twice daily. Plan to start spironolactone during next office visit if BMET remains stable and BP allows.    Amaka Gluth Rodriguez-Guzman PharmD, BCPS, CPP [facility] [address] [place]  [address] 12/02/2017 4:52 PM

## 2017-12-02 NOTE — Patient Instructions (Addendum)
Return for a a follow up appointment on July/04/2018  Check your blood pressure at home daily (if able) and keep record of the readings.  Take your BP meds as follows: **INCREASE carvedilol to 25mg  twice daily* - okay to use carvedilol 12.5mg  (2 tablets) twice daily until supply gone  *REPEAT blood work today*  Bring all of your meds, your BP cuff and your record of home blood pressures to your next appointment.  Exercise as you're able, try to walk approximately 30 minutes per day.  Keep salt intake to a minimum, especially watch canned and prepared boxed foods.  Eat more fresh fruits and vegetables and fewer canned items.  Avoid eating in fast food restaurants.    HOW TO TAKE YOUR BLOOD PRESSURE: . Rest 5 minutes before taking your blood pressure. .  Don't smoke or drink caffeinated beverages for at least 30 minutes before. . Take your blood pressure before (not after) you eat. . Sit comfortably with your back supported and both feet on the floor (don't cross your legs). . Elevate your arm to heart level on a table or a desk. . Use the proper sized cuff. It should fit smoothly and snugly around your bare upper arm. There should be enough room to slip a fingertip under the cuff. The bottom edge of the cuff should be 1 inch above the crease of the elbow. . Ideally, take 3 measurements at one sitting and record the average.

## 2017-12-02 NOTE — Assessment & Plan Note (Signed)
Blood pressure remains elevated this morning. Patient is not controlling her sodium intake or taking furosemide. No morning medication today either. Will continue Entresto at maximum dose of 97/103mg  twice daily and increase carvedilol to 25mg  twice daily. Plan to start spironolactone during next office visit if BMET remains stable and BP allows.

## 2017-12-23 ENCOUNTER — Ambulatory Visit: Payer: Medicare HMO | Admitting: Pharmacist Clinician (PhC)/ Clinical Pharmacy Specialist

## 2017-12-23 DIAGNOSIS — I5042 Chronic combined systolic (congestive) and diastolic (congestive) heart failure: Secondary | ICD-10-CM

## 2017-12-23 NOTE — Patient Instructions (Signed)
Call if you have any questions or concerns.  Devona Holmes/Raquel at [phone]  Your blood pressure today is 140/84  Check your blood pressure at home several times each week and keep record of the readings.  Take your BP meds as follows:  Continue with all your current medications  Bring all of your meds, your BP cuff and your record of home blood pressures to your next appointment.  Exercise as you're able, try to walk approximately 30 minutes per day.  Keep salt intake to a minimum, especially watch canned and prepared boxed foods.  Eat more fresh fruits and vegetables and fewer canned items.  Avoid eating in fast food restaurants.    HOW TO TAKE YOUR BLOOD PRESSURE: . Rest 5 minutes before taking your blood pressure. .  Don't smoke or drink caffeinated beverages for at least 30 minutes before. . Take your blood pressure before (not after) you eat. . Sit comfortably with your back supported and both feet on the floor (don't cross your legs). . Elevate your arm to heart level on a table or a desk. . Use the proper sized cuff. It should fit smoothly and snugly around your bare upper arm. There should be enough room to slip a fingertip under the cuff. The bottom edge of the cuff should be 1 inch above the crease of the elbow. . Ideally, take 3 measurements at one sitting and record the average.

## 2017-12-23 NOTE — Progress Notes (Signed)
HPI:  Denise Anderson is a 67 y.o. female patient of Dr Royann Shivers, with a PMH below who presents today for heart failure medication titration.  In addition to chronic combined systolic/diastolic heart failure her medical history is significant for hypertension and leukopenia.  An echocardiogram in September showed an EF of 15-20%. At that time losartan was discontinued and Entresto 24/26 mg started.  During follow up with DR Croitoru on 10/15/2017 Entresto dose was increased to 49/51mg  twice daily and further titrated to 97/103mg  twice daily on 11/04/17 by clinical pharmacist.  At her last CVRR visit on June 20 her blood pressure was elevated to 160/84 and patient admitted to dietary indescretion.  She had not been watching her salt intake and had eaten [place] food the evening prior to appointment.  She was reminded of need to strictly watch sodium intake, use her furosemide as needed for weight/edema issues, and was told to increase carvedilol from 12.5 mg to 25 mg twice daily.    Today she admits to doing a much better job with her diet.  Her weight has been steady between 188-192 pounds and has only used furosemide once since her last visit.  She has no lower extremity edema, shortness of breath, chest pain or dizziness.    Blood Pressure Goal:  130/80  Current Medications:  Entresto 97/103 mg twice daily  Carvedilol 25 mg twice daily   Family History:  Mother had couple of strokes, valve problems, died from cardiac issues (36's)  Father with no heart disease (late 92's)  1 brother with [place] vet, multiple strokes (Agent Orange exposure)  2 children, no health issues as of yet  Social History:  No tobacco; no alcohol (only rarely); coffee is decaf, does drink cola 1-2 times per week (12 oz can)  Diet:  Oatmeal for  breakfast most days; does like hot dogs (overboils to cut salt); mostly eats chicken, not much pork or red meats; occasional fish; vegetables are mostly frozen; not too much snack foods;   Exercise: none recently, but would like to get back to doing daily jumping jacks  Home BP readings:  17 readings; average 126/86 (-3/-4 from last month) (HR 57-83 bpm) *Home monitor equate (arm cuff) - accurate within when using appropriate technique    Intolerances:  nkda  Wt Readings from Last 3 Encounters:  12/02/17 191 lb (86.6 kg)  10/15/17 187 lb 6.4 oz (85 kg)  07/08/17 183 lb 12.8 oz (83.4 kg)   BP Readings from Last 3 Encounters:  12/23/17 140/84  12/02/17 (!) 160/84  11/04/17 (!) 162/94   Pulse Readings from Last 3 Encounters:  12/23/17 (!) 54  12/02/17 65  11/04/17 60    Current Outpatient Medications  Medication Sig Dispense Refill  . aspirin EC 81 MG EC tablet Take 1 tablet (81 mg total) by mouth daily. 30 tablet 6  . carvedilol (COREG) 25 MG tablet Take 1 tablet (25 mg total) by mouth 2 (two) times daily with a meal. Note dose change 60 tablet 1  . furosemide (LASIX) 20 MG tablet Take one tablet-po PRN forSOB AND WT GAIN OF >3#qd/>5#WK 30 tablet 2  . Garlic 2000 MG CAPS Take 4,000 mg by mouth daily.    Marland Kitchen OVER THE COUNTER MEDICATION Take 1 capsule by mouth 2 (two) times daily. [place] Capsule    . sacubitril-valsartan (ENTRESTO) 97-103 MG Take 1 tablet by mouth 2 (two) times daily. 60 tablet 5   No current facility-administered medications for this  visit.     No Known Allergies  Past Medical History:  Diagnosis Date  . CHF (congestive heart failure), NYHA class II, acute, combined (HCC) 02/15/2017  . Hypertension   . Leukopenia   . Shingles     Blood pressure 140/84, pulse (!) 54.  Chronic combined systolic and diastolic heart failure (HCC) Patient with combined systolic/diastolic heart failure.  She is feeling much better with her current medication regimen  and her blood pressure appears to be controlled.  She is to continue monitoring home blood pressures 3-4 times per week.  No changes to medications today.  She knows to call the office with any questions or should she see an increase in her home pressure readings.  She was praised on her decrease in salt intake and encouraged to continue this to avoid further complications.    Phillips Hay PharmD, CPP [facility] [address] [place] [address] 12/24/2017 8:15 AM

## 2017-12-24 ENCOUNTER — Encounter: Payer: Self-pay | Admitting: Pharmacist Clinician (PhC)/ Clinical Pharmacy Specialist

## 2017-12-24 NOTE — Progress Notes (Signed)
Thank you MCr 

## 2017-12-24 NOTE — Assessment & Plan Note (Signed)
Patient with combined systolic/diastolic heart failure.  She is feeling much better with her current medication regimen and her blood pressure appears to be controlled.  She is to continue monitoring home blood pressures 3-4 times per week.  No changes to medications today.  She knows to call the office with any questions or should she see an increase in her home pressure readings.  She was praised on her decrease in salt intake and encouraged to continue this to avoid further complications.

## 2018-02-27 ENCOUNTER — Other Ambulatory Visit: Payer: Self-pay | Admitting: Cardiovascular Disease

## 2018-03-01 DIAGNOSIS — I5022 Chronic systolic (congestive) heart failure: Secondary | ICD-10-CM | POA: Diagnosis not present

## 2018-03-01 DIAGNOSIS — N183 Chronic kidney disease, stage 3 (moderate): Secondary | ICD-10-CM | POA: Diagnosis not present

## 2018-03-01 DIAGNOSIS — Z23 Encounter for immunization: Secondary | ICD-10-CM | POA: Diagnosis not present

## 2018-03-01 DIAGNOSIS — E785 Hyperlipidemia, unspecified: Secondary | ICD-10-CM | POA: Diagnosis not present

## 2018-03-01 DIAGNOSIS — Z0000 Encounter for general adult medical examination without abnormal findings: Secondary | ICD-10-CM | POA: Diagnosis not present

## 2018-03-01 DIAGNOSIS — I10 Essential (primary) hypertension: Secondary | ICD-10-CM | POA: Diagnosis not present

## 2018-06-07 ENCOUNTER — Other Ambulatory Visit: Payer: Self-pay | Admitting: Cardiovascular Disease

## 2018-07-14 ENCOUNTER — Other Ambulatory Visit: Payer: Self-pay | Admitting: Cardiovascular Disease

## 2018-07-14 NOTE — Telephone Encounter (Signed)
Rx request sent to pharmacy.  

## 2018-07-26 ENCOUNTER — Other Ambulatory Visit: Payer: Self-pay | Admitting: Cardiovascular Disease

## 2018-10-19 ENCOUNTER — Telehealth: Payer: Self-pay

## 2018-10-19 ENCOUNTER — Encounter: Payer: Self-pay | Admitting: Cardiovascular Disease

## 2018-10-19 ENCOUNTER — Telehealth (INDEPENDENT_AMBULATORY_CARE_PROVIDER_SITE_OTHER): Payer: Medicare Other | Admitting: Cardiovascular Disease

## 2018-10-19 VITALS — BP 130/88 | HR 74 | Ht 66.0 in | Wt 189.0 lb

## 2018-10-19 DIAGNOSIS — I5042 Chronic combined systolic (congestive) and diastolic (congestive) heart failure: Secondary | ICD-10-CM

## 2018-10-19 DIAGNOSIS — I10 Essential (primary) hypertension: Secondary | ICD-10-CM

## 2018-10-19 DIAGNOSIS — Z79899 Other long term (current) drug therapy: Secondary | ICD-10-CM

## 2018-10-19 NOTE — Telephone Encounter (Signed)
Patient and/or DPR-approved person aware of AVS instructions and verbalized understanding. Letter including After Visit Summary and any other necessary documents to be mailed to the patient's address on file.  

## 2018-10-19 NOTE — Progress Notes (Signed)
Virtual Visit via Video Note   This visit type was conducted due to national recommendations for restrictions regarding the COVID-19 Pandemic (e.g. social distancing) in an effort to limit this patient's exposure and mitigate transmission in our community.  Due to her co-morbid illnesses, this patient is at least at moderate risk for complications without adequate follow up.  This format is felt to be most appropriate for this patient at this time.  All issues noted in this document were discussed and addressed.  A limited physical exam was performed with this format.  Please refer to the patient's chart for her consent to telehealth for [facility].   Date:  10/19/2018   ID:  Denise Anderson, DOB 03-Jan-1951, MRN [id number]  Patient Location: Home Provider Location: Home  PCP:  Shirlean Mylar, MD  Cardiologist:  Thurmon Fair, MD  Electrophysiologist:  None   Evaluation Performed:  Follow-Up Visit  Chief Complaint:  CHF, HTN  History of Present Illness:    Denise Anderson is a 68 y.o. female with presented with severe systemic hypertension and severe left ventricular systolic dysfunction with an ejection fraction of 25%, with remarkable improvement after institution of heart failure medications.  She is now on maximum dose carvedilol and Entresto, well-tolerated and left ventricular systolic function has normalized, most recently LVEF 50-55% in January 2019.  She feels great. Carries up and down stairs groceries without dyspnea. The patient specifically denies any chest pain at rest exertion, dyspnea at rest or with exertion, orthopnea, paroxysmal nocturnal dyspnea, syncope, palpitations, focal neurological deficits, intermittent claudication, lower extremity edema, unexplained weight gain, cough, hemoptysis or wheezing.  The patient does not have symptoms concerning for COVID-19 infection (fever, chills, cough, or new shortness of breath).    Past Medical History:  Diagnosis Date  . CHF  (congestive heart failure), NYHA class II, acute, combined (HCC) 02/15/2017  . Hypertension   . Leukopenia   . Shingles    Past Surgical History:  Procedure Laterality Date  . TUBAL LIGATION       Current Meds  Medication Sig  . aspirin EC 81 MG EC tablet Take 1 tablet (81 mg total) by mouth daily.  . carvedilol (COREG) 25 MG tablet TAKE 1 TABLET BY MOUTH TWICE DAILY WITH A MEAL  . ENTRESTO 97-103 MG TAKE 1 TABLET BY MOUTH TWICE DAILY  . furosemide (LASIX) 20 MG tablet Take one tablet-po PRN forSOB AND WT GAIN OF >3#qd/>5#WK  . Garlic 2000 MG CAPS Take 4,000 mg by mouth daily.  Marland Kitchen OVER THE COUNTER MEDICATION Take 1 capsule by mouth 2 (two) times daily. [place] Capsule     Allergies:   Patient has no known allergies.   Social History   Tobacco Use  . Smoking status: Never Smoker  . Smokeless tobacco: Never Used  Substance Use Topics  . Alcohol use: No  . Drug use: No     Family Hx: The patient's family history includes CAD in her father and mother; CVA in her brother; Valvular heart disease in her mother.  ROS:   Please see the history of present illness.     All other systems reviewed and are negative.   Prior CV studies:   The following studies were reviewed today:  Last echo  Labs/Other Tests and Data Reviewed:    EKG:  An ECG dated 10/15/2017 was personally reviewed today and demonstrated:  NSR, LVH w secondary repol changes  Recent Labs: 12/02/2017: BUN 14; Creatinine, Ser 0.94; Potassium 4.1; Sodium 144  Recent Lipid Panel Lab Results  Component Value Date/Time   CHOL 157 02/17/2017 03:07 AM   TRIG 114 02/17/2017 03:07 AM   HDL 53 02/17/2017 03:07 AM   CHOLHDL 3.0 02/17/2017 03:07 AM   LDLCALC 81 02/17/2017 03:07 AM    Wt Readings from Last 3 Encounters:  10/19/18 189 lb (85.7 kg)  12/02/17 191 lb (86.6 kg)  10/15/17 187 lb 6.4 oz (85 kg)     Objective:    Vital Signs:  BP 130/88   Pulse 74   Ht 5\' 6"  (1.676 m)   Wt 189 lb (85.7 kg)   BMI  30.51 kg/m    VITAL SIGNS:  reviewed GEN:  no acute distress EYES:  sclerae anicteric, EOMI - Extraocular Movements Intact RESPIRATORY:  normal respiratory effort, symmetric expansion CARDIOVASCULAR:  no peripheral edema SKIN:  no rash, lesions or ulcers. MUSCULOSKELETAL:  no obvious deformities. NEURO:  alert and oriented x 3, no obvious focal deficit PSYCH:  normal affect  ASSESSMENT & PLAN:    1. CHF: Excellent response to medical therapy.  Clinically euvolemic.  NYHA functional class I.  Low-dose of loop diuretic.  Encouraged more physical activity and reminded her about the importance of sodium restriction and daily weight monitoring.  Need to check labs. 2. HTN: Diastolic blood pressure is a little high.  She states that it usually oscillates between 78 and 88 and is never 90 or higher.  Rather than start more medications we talked about careful avoidance of excess sodium and regular physical activity and weight loss.  She will send [place] some blood pressure readings in several more weeks. 3. Mild obesity: Weight loss of 10-15 pounds in the next 6 months to a year is a realistic targets that she can achieve.  COVID-19 Education: The signs and symptoms of COVID-19 were discussed with the patient and how to seek care for testing (follow up with PCP or arrange E-visit).  The importance of social distancing was discussed today.  Time:   Today, I have spent 16 minutes with the patient with telehealth technology discussing the above problems.     Medication Adjustments/Labs and Tests Ordered: Current medicines are reviewed at length with the patient today.  Concerns regarding medicines are outlined above.   Tests Ordered: No orders of the defined types were placed in this encounter.   Medication Changes: No orders of the defined types were placed in this encounter.   Disposition:  Follow up 12 months  Signed, Thurmon Fair, MD  10/19/2018 10:05 AM    [facility]

## 2018-10-19 NOTE — Patient Instructions (Signed)
Medication Instructions:  Your physician recommends that you continue on your current medications as directed. Please refer to the Current Medication list given to you today.  If you need a refill on your cardiac medications before your next appointment, please call your pharmacy.   Lab work: Your physician recommends that you return for lab work at your earliest convenience: CMP (COMPREHENSIVE METABOLIC PANEL). NO APPOINTMENT IS NEEDED. YOU WILL RECEIVE A LAB SLIP IN THE MAIL.  If you have labs (blood work) drawn today and your tests are completely normal, you will receive your results only by: Marland Kitchen MyChart Message (if you have MyChart) OR . A paper copy in the mail If you have any lab test that is abnormal or we need to change your treatment, we will call you to review the results.  Testing/Procedures: NONE  Follow-Up: At [facility], you and your health needs are our priority.  As part of our continuing mission to provide you with exceptional heart care, we have created designated Provider Care Teams.  These Care Teams include your primary Cardiologist (physician) and Advanced Practice Providers (APPs -  Physician Assistants and Nurse Practitioners) who all work together to provide you with the care you need, when you need it. You will need a follow up appointment in 12 months.  Please call our office 2 months in advance to schedule this appointment.  You may see Thurmon Fair, MD or one of the following Advanced Practice Providers on your designated Care Team: [place], [place] . Micah Flesher, PA-C

## 2018-10-24 ENCOUNTER — Telehealth: Payer: Medicare HMO | Admitting: Cardiovascular Disease

## 2018-11-06 ENCOUNTER — Other Ambulatory Visit: Payer: Self-pay | Admitting: Cardiovascular Disease

## 2018-11-08 NOTE — Telephone Encounter (Signed)
Rx request sent to pharmacy.  

## 2018-11-16 LAB — COMPREHENSIVE METABOLIC PANEL
ALT: 12 IU/L (ref 0–32)
AST: 15 IU/L (ref 0–40)
Albumin/Globulin Ratio: 1.4 (ref 1.2–2.2)
Albumin: 4 g/dL (ref 3.8–4.8)
Alkaline Phosphatase: 76 IU/L (ref 39–117)
BUN/Creatinine Ratio: 13 (ref 12–28)
BUN: 13 mg/dL (ref 8–27)
Bilirubin Total: 0.9 mg/dL (ref 0.0–1.2)
CO2: 26 mmol/L (ref 20–29)
Calcium: 9.4 mg/dL (ref 8.7–10.3)
Chloride: 102 mmol/L (ref 96–106)
Creatinine, Ser: 0.97 mg/dL (ref 0.57–1.00)
GFR calc Af Amer: 69 mL/min/{1.73_m2} (ref >59)
GFR calc non Af Amer: 60 mL/min/{1.73_m2} (ref >59)
Globulin, Total: 2.8 g/dL (ref 1.5–4.5)
Glucose: 109 mg/dL — ABNORMAL HIGH (ref 65–99)
Potassium: 4.5 mmol/L (ref 3.5–5.2)
Sodium: 142 mmol/L (ref 134–144)
Total Protein: 6.8 g/dL (ref 6.0–8.5)

## 2018-12-11 ENCOUNTER — Other Ambulatory Visit: Payer: Self-pay | Admitting: Cardiovascular Disease

## 2019-02-11 ENCOUNTER — Other Ambulatory Visit: Payer: Self-pay | Admitting: Cardiovascular Disease

## 2019-10-18 NOTE — Progress Notes (Signed)
Cardiology Office Note:    Date:  10/19/2019   ID:  Denise Anderson, DOB 1951-01-30, MRN [id number]  PCP:  Shirlean Mylar, MD  Cardiologist:  Thurmon Fair, MD   Referring MD: Shirlean Mylar, MD   Chief Complaint  Patient presents with  . Follow-up    12 months.  CHF, NICM  History of Present Illness:    Denise Anderson is a 69 y.o. female with a hx of severe hypertension, chronic systolic and diastolic heart failure with EF 25%. She was started GDMT with corge and entresto. Repeat echo 2019 with improved EF of 50-55%. Nuclear stress test in 02/2017 was negative for ischemia, suggesting NICM, likely due to hypertension. She was last seen by Dr. Royann Shivers 10/2018 and was doing well at that time, although not exercising as much as she has in the past.   She presents today for routine follow up. Her BP is mildly elevated, but she had high sodium [place] food last night for May 5. She does not generally take her BP at home. Review of last notes reveals normal BP. She has suffered weight gain during pandemic. She is going to try to increase activity outdoors. She is not going to take the COVID-19 vaccine.    Past Medical History:  Diagnosis Date  . CHF (congestive heart failure), NYHA class II, acute, combined (HCC) 02/15/2017  . Hypertension   . Leukopenia   . Shingles     Past Surgical History:  Procedure Laterality Date  . TUBAL LIGATION      Current Medications: Current Meds  Medication Sig  . aspirin EC 81 MG EC tablet Take 1 tablet (81 mg total) by mouth daily.  . carvedilol (COREG) 25 MG tablet TAKE 1 TABLET BY MOUTH TWICE DAILY WITH A MEAL  . docusate sodium (COLACE) 100 MG capsule Take 100 mg by mouth daily.  Marland Kitchen ENTRESTO 97-103 MG Take 1 tablet by mouth twice daily  . furosemide (LASIX) 20 MG tablet Take one tablet-po PRN forSOB AND WT GAIN OF >3#qd/>5#WK  . Garlic 2000 MG CAPS Take 4,000 mg by mouth daily.  . Multiple Vitamins-Minerals (CENTRUM SILVER 50+WOMEN PO) Take 1 tablet by  mouth daily.  Marland Kitchen OVER THE COUNTER MEDICATION Take 1 capsule by mouth 2 (two) times daily. [place] Capsule     Allergies:   Patient has no known allergies.   Social History   Socioeconomic History  . Marital status: Divorced    Spouse name: Not on file  . Number of children: Not on file  . Years of education: Not on file  . Highest education level: Not on file  Occupational History  . Occupation: Psychologist, forensic  Tobacco Use  . Smoking status: Never Smoker  . Smokeless tobacco: Never Used  Substance and Sexual Activity  . Alcohol use: No  . Drug use: No  . Sexual activity: Not on file  Other Topics Concern  . Not on file  Social History Narrative  . Not on file   Social Determinants of Health   Financial Resource Strain:   . Difficulty of Paying Living Expenses:   Food Insecurity:   . Worried About Programme researcher, broadcasting/film/video in the Last Year:   . Barista in the Last Year:   Transportation Needs:   . Freight forwarder (Medical):   Marland Kitchen Lack of Transportation (Non-Medical):   Physical Activity:   . Days of Exercise per Week:   . Minutes of Exercise per Session:   Stress:   .  Feeling of Stress :   Social Connections:   . Frequency of Communication with Friends and Family:   . Frequency of Social Gatherings with Friends and Family:   . Attends Religious Services:   . Active Member of Clubs or Organizations:   . Attends Banker Meetings:   Marland Kitchen Marital Status:      Family History: The patient's family history includes CAD in her father and mother; CVA in her brother; Valvular heart disease in her mother.  ROS:   Please see the history of present illness.     All other systems reviewed and are negative.  EKGs/Labs/Other Studies Reviewed:    The following studies were reviewed today:  Echo 06/18/17: Study Conclusions   - Left ventricle: The cavity size was normal. Systolic function was  at the lower limits of normal. The estimated ejection  fraction  was in the range of 50% to 55%. Wall motion was normal; there  were no regional wall motion abnormalities. Doppler parameters  are consistent with abnormal left ventricular relaxation (grade 1  diastolic dysfunction).  - Left atrium: The atrium was mildly dilated.   Impressions:   - There is substantial improvement in left ventricular function  since February 16, 2017.   EKG:  EKG is ordered today.  The ekg ordered today demonstrates sinus rhythm HR 71, TWI III, AVF - improved from prior  Recent Labs: 11/16/2018: ALT 12; BUN 13; Creatinine, Ser 0.97; Potassium 4.5; Sodium 142  Recent Lipid Panel    Component Value Date/Time   CHOL 157 02/17/2017 0307   TRIG 114 02/17/2017 0307   HDL 53 02/17/2017 0307   CHOLHDL 3.0 02/17/2017 0307   VLDL 23 02/17/2017 0307   LDLCALC 81 02/17/2017 0307    Physical Exam:    VS:  BP (!) 140/98 (BP Location: Right Arm, Patient Position: Sitting, Cuff Size: Normal)   Pulse 71   Ht 5\' 6"  (1.676 m)   Wt 204 lb (92.5 kg)   BMI 32.93 kg/m     Wt Readings from Last 3 Encounters:  10/19/19 204 lb (92.5 kg)  10/19/18 189 lb (85.7 kg)  12/02/17 191 lb (86.6 kg)     GEN: Well nourished, well developed in no acute distress HEENT: Normal NECK: No JVD; No carotid bruits LYMPHATICS: No lymphadenopathy CARDIAC: RRR, no murmurs, rubs, gallops RESPIRATORY:  Clear to auscultation without rales, wheezing or rhonchi  ABDOMEN: Soft, non-tender, non-distended MUSCULOSKELETAL:  No edema; No deformity  SKIN: Warm and dry NEUROLOGIC:  Alert and oriented x 3 PSYCHIATRIC:  Normal affect   ASSESSMENT:    1. Chronic combined systolic and diastolic heart failure (HCC)   2. Essential hypertension   3. Nonischemic cardiomyopathy (HCC)   4. Diabetes mellitus screening   5. Screening cholesterol level    PLAN:    In order of problems listed above:  NICM Chronic systolic and diastolic heart failure Hypertension - EF in 2019 improved  to 50-55% on GDMT - continue BB and entresto - she has 20 mg lasix to use PRN - has not used - overall she is doing very well from a cardiac perspective - BP mildly up today, but she ate high sodium meal last night - continue to monitor   Risk factor modification  - will collect lipids (has had toast) - obtain A1c - last one in 2018 was 5.8%   Medication Adjustments/Labs and Tests Ordered: Current medicines are reviewed at length with the patient today.  Concerns regarding  medicines are outlined above.  Orders Placed This Encounter  Procedures  . Basic metabolic panel  . Lipid panel  . Hemoglobin A1c  . EKG 12-Lead   No orders of the defined types were placed in this encounter.   Signed, Ledora Bottcher, [place]  10/19/2019 10:25 AM    [facility]

## 2019-10-19 ENCOUNTER — Ambulatory Visit: Payer: Medicare Other | Admitting: Physician Assistant

## 2019-10-19 ENCOUNTER — Encounter: Payer: Self-pay | Admitting: Physician Assistant

## 2019-10-19 ENCOUNTER — Other Ambulatory Visit: Payer: Self-pay

## 2019-10-19 VITALS — BP 140/98 | HR 71 | Ht 66.0 in | Wt 204.0 lb

## 2019-10-19 DIAGNOSIS — I428 Other cardiomyopathies: Secondary | ICD-10-CM

## 2019-10-19 DIAGNOSIS — Z13220 Encounter for screening for lipoid disorders: Secondary | ICD-10-CM

## 2019-10-19 DIAGNOSIS — I5042 Chronic combined systolic (congestive) and diastolic (congestive) heart failure: Secondary | ICD-10-CM | POA: Diagnosis not present

## 2019-10-19 DIAGNOSIS — I10 Essential (primary) hypertension: Secondary | ICD-10-CM | POA: Diagnosis not present

## 2019-10-19 DIAGNOSIS — Z131 Encounter for screening for diabetes mellitus: Secondary | ICD-10-CM

## 2019-10-19 NOTE — Patient Instructions (Addendum)
Medication Instructions:  Your physician recommends that you continue on your current medications as directed. Please refer to the Current Medication list given to you today.  *If you need a refill on your cardiac medications before your next appointment, please call your pharmacy*  Labs: Your physician recommends that you return for a FASTING lipid profile, BMET, and Hemoglobin A1C today.   Follow-Up: At [facility], you and your health needs are our priority.  As part of our continuing mission to provide you with exceptional heart care, we have created designated Provider Care Teams.  These Care Teams include your primary Cardiologist (physician) and Advanced Practice Providers (APPs -  Physician Assistants and Nurse Practitioners) who all work together to provide you with the care you need, when you need it.  We recommend signing up for the patient portal called "MyChart".  Sign up information is provided on this After Visit Summary.  MyChart is used to connect with patients for Virtual Visits (Telemedicine).  Patients are able to view lab/test results, encounter notes, upcoming appointments, etc.  Non-urgent messages can be sent to your provider as well.   To learn more about what you can do with MyChart, go to [email or web address].    Your next appointment:   12 month(s)  The format for your next appointment:   In Person  Provider:   You may see Thurmon Fair, MD or one of the following Advanced Practice Providers on your designated Care Team:    Azalee Course, PA-C  Micah Flesher, [place] or   Judy Pimple, [place]    Other Instructions Please call our office 2 months in advance to schedule your annual follow-up appointment with Dr. Royann Shivers.

## 2019-10-20 LAB — BASIC METABOLIC PANEL
BUN/Creatinine Ratio: 17 (ref 12–28)
BUN: 15 mg/dL (ref 8–27)
CO2: 26 mmol/L (ref 20–29)
Calcium: 9.9 mg/dL (ref 8.7–10.3)
Chloride: 102 mmol/L (ref 96–106)
Creatinine, Ser: 0.89 mg/dL (ref 0.57–1.00)
GFR calc Af Amer: 76 mL/min/{1.73_m2} (ref >59)
GFR calc non Af Amer: 66 mL/min/{1.73_m2} (ref >59)
Glucose: 84 mg/dL (ref 65–99)
Potassium: 5.2 mmol/L (ref 3.5–5.2)
Sodium: 141 mmol/L (ref 134–144)

## 2019-10-20 LAB — LIPID PANEL
Chol/HDL Ratio: 2.6 ratio (ref 0.0–4.4)
Cholesterol, Total: 204 mg/dL — ABNORMAL HIGH (ref 100–199)
HDL: 80 mg/dL (ref >39)
LDL Chol Calc (NIH): 113 mg/dL — ABNORMAL HIGH (ref 0–99)
Triglycerides: 58 mg/dL (ref 0–149)
VLDL Cholesterol Cal: 11 mg/dL (ref 5–40)

## 2019-10-20 LAB — HEMOGLOBIN A1C
Est. average glucose Bld gHb Est-mCnc: 126 mg/dL
Hgb A1c MFr Bld: 6 % — ABNORMAL HIGH (ref 4.8–5.6)

## 2020-01-08 ENCOUNTER — Other Ambulatory Visit: Payer: Self-pay | Admitting: Cardiovascular Disease

## 2020-03-03 ENCOUNTER — Other Ambulatory Visit: Payer: Self-pay | Admitting: Cardiovascular Disease

## 2020-04-15 ENCOUNTER — Other Ambulatory Visit: Payer: Self-pay | Admitting: Family Medicine

## 2020-04-15 DIAGNOSIS — Z1231 Encounter for screening mammogram for malignant neoplasm of breast: Secondary | ICD-10-CM

## 2020-04-16 ENCOUNTER — Other Ambulatory Visit: Payer: Self-pay

## 2020-04-16 ENCOUNTER — Ambulatory Visit
Admission: RE | Admit: 2020-04-16 | Discharge: 2020-04-16 | Disposition: A | Discharge status: Home / Self Care | Payer: Medicare Other | Source: Ambulatory Visit | Attending: Family Medicine | Admitting: Family Medicine

## 2020-04-16 DIAGNOSIS — Z1231 Encounter for screening mammogram for malignant neoplasm of breast: Secondary | ICD-10-CM

## 2020-08-15 DIAGNOSIS — Z008 Encounter for other general examination: Secondary | ICD-10-CM | POA: Diagnosis not present

## 2020-08-15 DIAGNOSIS — I509 Heart failure, unspecified: Secondary | ICD-10-CM | POA: Diagnosis not present

## 2020-08-15 DIAGNOSIS — Z7289 Other problems related to lifestyle: Secondary | ICD-10-CM | POA: Diagnosis not present

## 2020-08-15 DIAGNOSIS — I110 Hypertensive heart disease with heart failure: Secondary | ICD-10-CM | POA: Diagnosis not present

## 2020-09-16 ENCOUNTER — Other Ambulatory Visit: Payer: Self-pay | Admitting: Cardiovascular Disease

## 2020-11-27 IMAGING — MG DIGITAL SCREENING BILAT W/ CAD
5 series · 5 of 5 positions shown · non-contrast
Comparison: Previous exam(s).

CLINICAL DATA: Screening.

EXAM:
DIGITAL SCREENING BILATERAL MAMMOGRAM WITH CAD

[L MLO (1 of 2)]
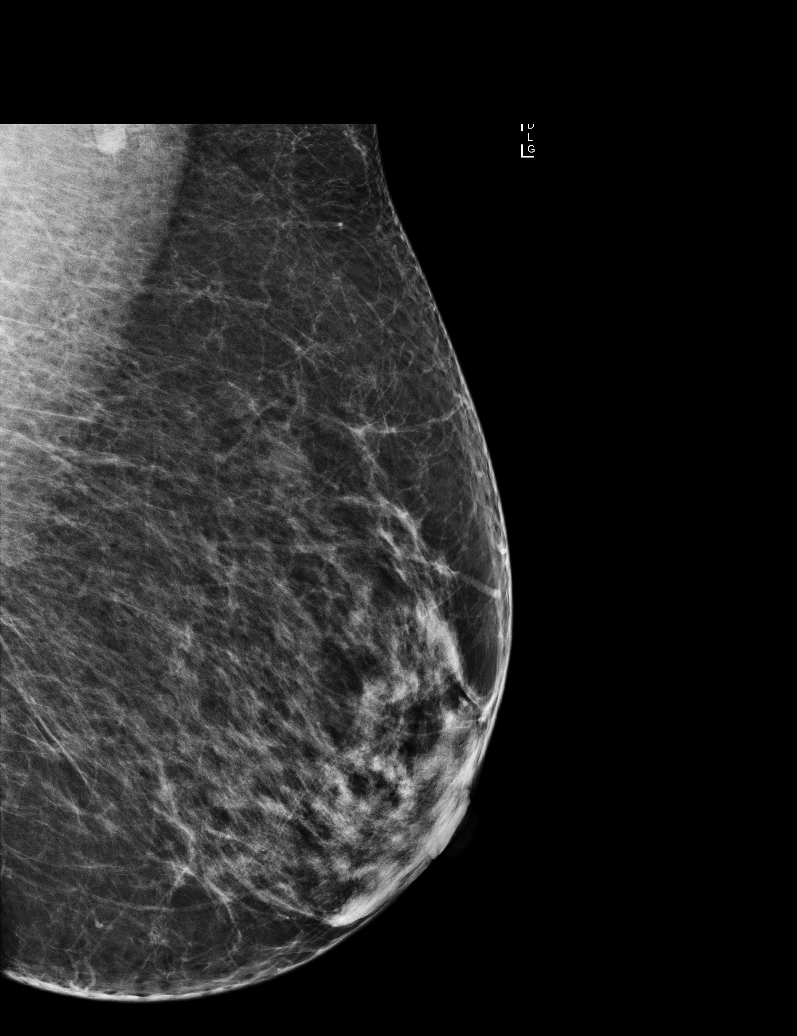

[R CC]
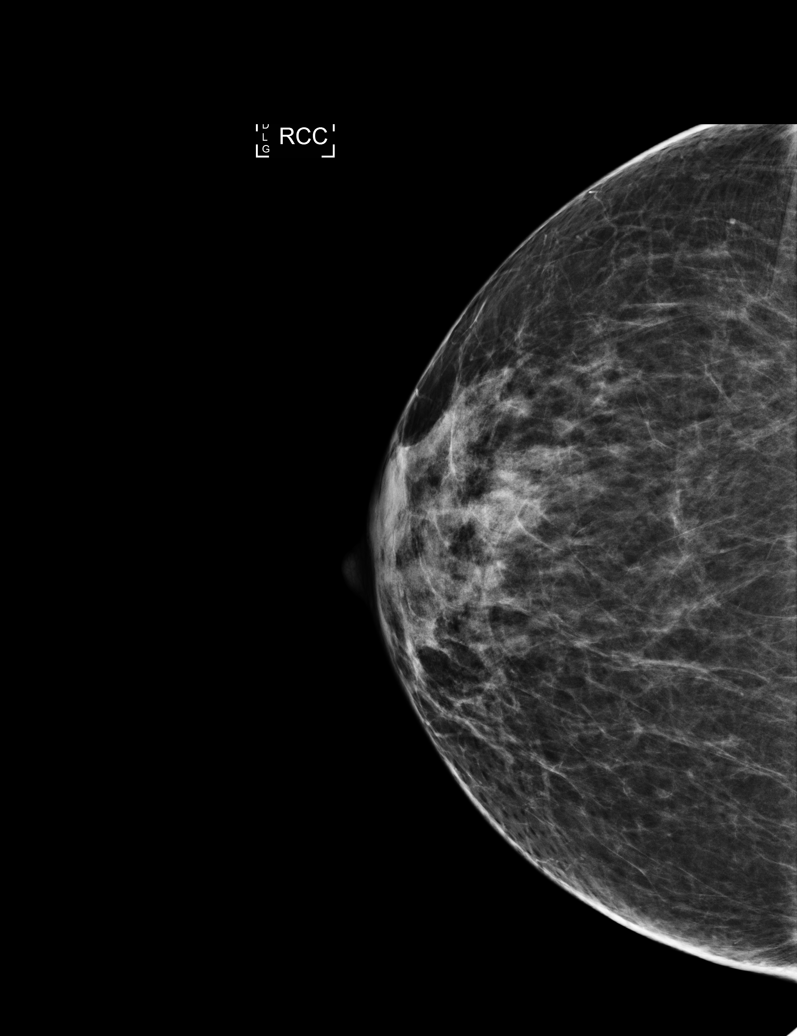

[L MLO (2 of 2)]
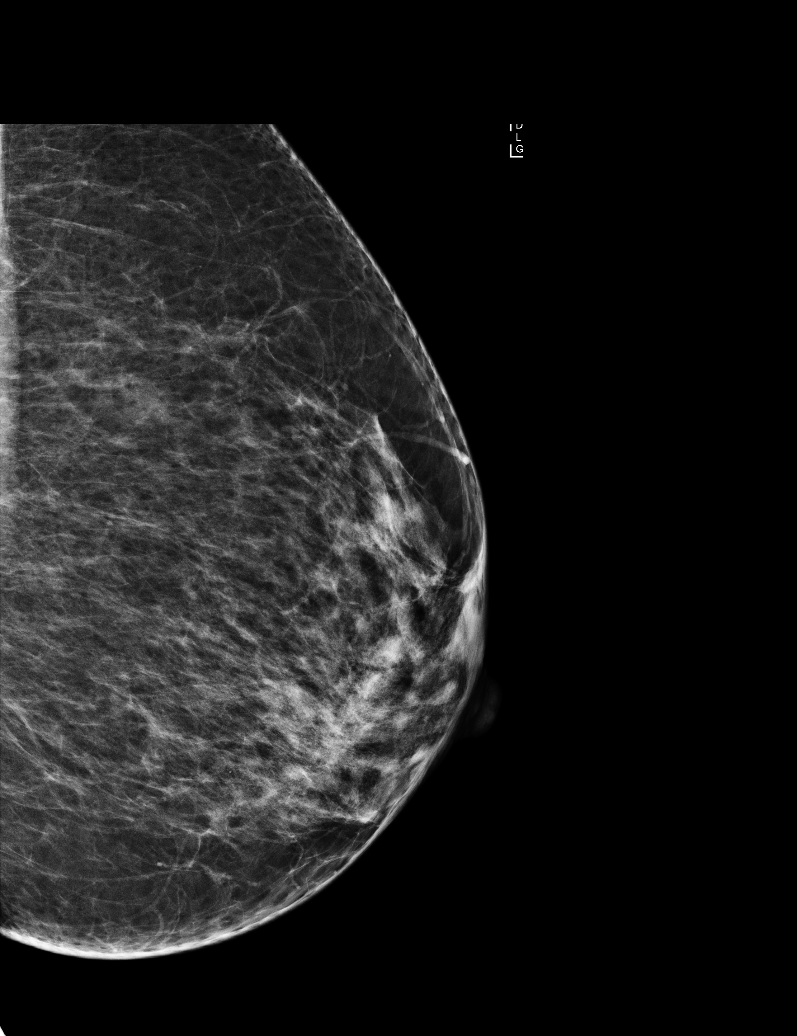

[R MLO]
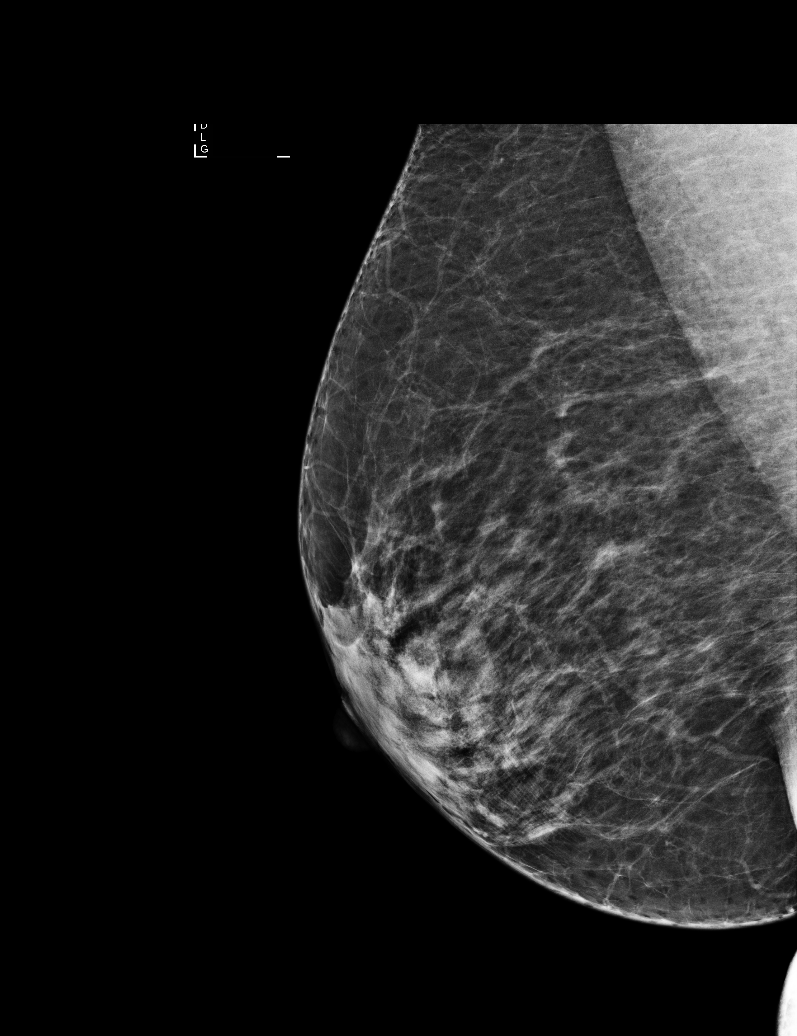

[L CC]
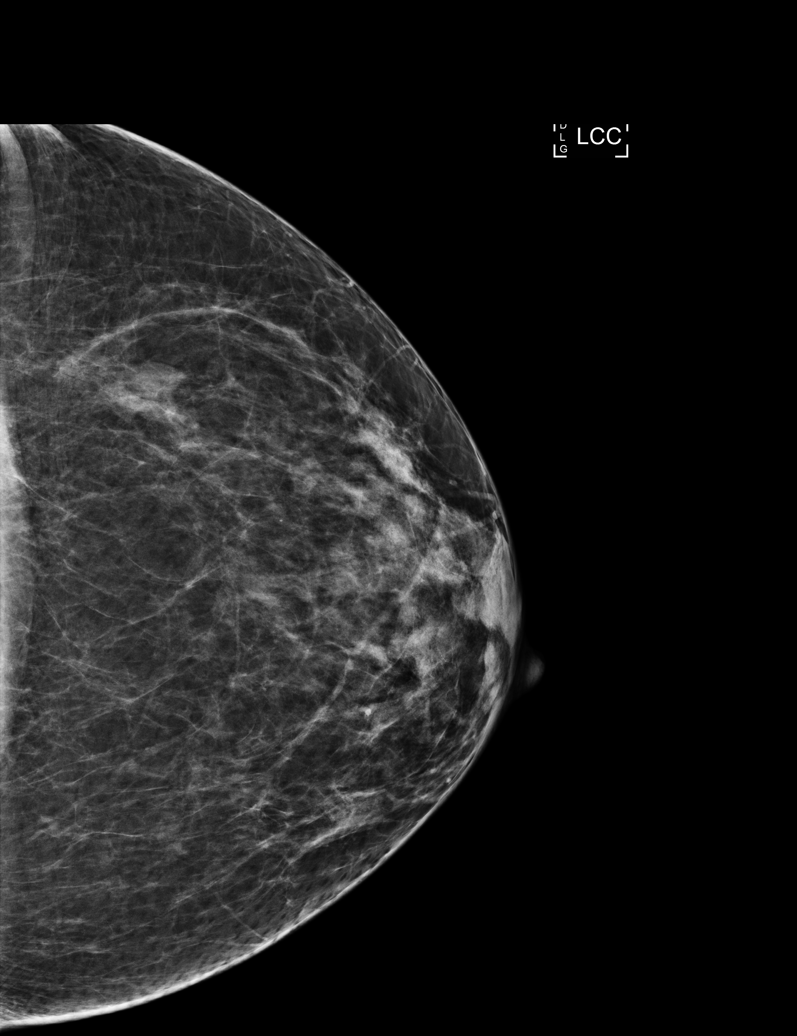

[5 of 5 positions shown; findings below may reference images not displayed]

ACR Breast Density Category b: There are scattered areas of
fibroglandular density.
FINDINGS: There are no findings suspicious for malignancy. Images were
processed with CAD.
IMPRESSION: No mammographic evidence of malignancy. A result letter of this
screening mammogram will be mailed directly to the patient.

RECOMMENDATION:
Screening mammogram in one year. (Code:AS-G-LCT)

BI-RADS CATEGORY  1: Negative.

## 2020-12-25 ENCOUNTER — Other Ambulatory Visit: Payer: Self-pay | Admitting: Cardiovascular Disease

## 2021-01-27 ENCOUNTER — Telehealth: Payer: Self-pay | Admitting: Cardiovascular Disease

## 2021-01-27 MED ORDER — ENTRESTO 97-103 MG PO TABS: ORAL_TABLET | ORAL | 1 refills | Status: DC

## 2021-01-27 NOTE — Telephone Encounter (Signed)
Refill sent.

## 2021-01-27 NOTE — Telephone Encounter (Signed)
*  STAT* If patient is at the pharmacy, call can be transferred to refill team.   1. Which medications need to be refilled? (please list name of each medication and dose if known)  sacubitril-valsartan (ENTRESTO) 97-103 MG  2. Which pharmacy/location (including street and city if local pharmacy) is medication to be sent to? [facility] [address] [place], [place] [address]  3. Do they need a 30 day or 90 day supply? Enough to last until 03/20/21 appt date. Runs out of medication this week. Is not leaving [place] until the middle of next week.

## 2021-01-27 NOTE — Telephone Encounter (Signed)
*  STAT* If patient is at the pharmacy, call can be transferred to refill team.   1. Which medications need to be refilled? (please list name of each medication and dose if known) sacubitril-valsartan (ENTRESTO) 97-103 MG  2. Which pharmacy/location (including street and city if local pharmacy) is medication to be sent to? [facility]  [address] [place], [place] [address] [phone]   3. Do they need a 30 day or 90 day supply? 30DS  Pt c/o medication issue:  1. Name of Medication: sacubitril-valsartan (ENTRESTO) 97-103 MG  2. How are you currently taking this medication (dosage and times per day)?  Take 1 tablet by mouth twice daily  3. Are you having a reaction (difficulty breathing--STAT)? NO  4. What is your medication issue? Pts medication was sent to the incorrect pharmacy

## 2021-03-05 ENCOUNTER — Other Ambulatory Visit: Payer: Self-pay | Admitting: Cardiovascular Disease

## 2021-03-18 NOTE — Progress Notes (Signed)
Cardiology Office Note:    Date:  03/20/2021   ID:  Denise Anderson, DOB [birth year], MRN [id number]  PCP:  Shirlean Mylar, MD  Cardiologist:  Thurmon Fair, MD  Electrophysiologist:  None   Referring MD: Shirlean Mylar, MD   Chief Complaint: follow-up of CHF  History of Present Illness:    Denise Anderson is a 70 y.o. female with a history of non-ischemic cardiomyopathy/ chronic combined with EF as low as 15-20% but improved to 50-55% on Echo in 06/2017, hypertension, dyslipidemia, and prediabetes who is followed by Dr. Royann Shivers and presents today for routine follow-up.  Patient has history of non-ischemic cardiomyopathy with LVEF as low as 15-20% in 02/2017.  Myoview at that time showed no perfusion defects. Cardiomyopathy felt to be nonischemic in nature, likely due to severe systemic hypertension. She was started on GDMT and LVEF normalized. Most recent Echo in 06/2017 showed LVEF of 50-55% with normal wall motion and grade 1 diastolic dysfunction.  Patient was last seen by Bettina Gavia, PA, in 10/2019 at which she was doing very well from a cardiac standpoint but had put weight during the pandemic.  Patient presents today for follow-up.  Here alone.  Patient has done well from a cardiac standpoint since last visit.  She denies any chest pain, shortness of breath, orthopnea, PND, lower extremity edema, palpitations, lightheadedness, dizziness, syncope.  She does not weigh daily but weight is down 4 pounds from where he was at last visit.  She does monitor her sodium intake closely.  Her only complaint today is some numbness and tingling in the fingers of her right hand.  She states this has been going on for a couple of weeks.  No other numbness or tingling in that arm. She has strong and equal radial pulses bilaterally.  I also personally checked her BP in both arms and they are the same.  She has no known history of any cervical spine issues; however, she does state that her neck has always very stiff.  She  has seen a chiropractor for this in the past.  Past Medical History:  Diagnosis Date   Chronic combined systolic and diastolic CHF (congestive heart failure) (HCC)    Dyslipidemia    Hypertension    Leukopenia    Non-ischemic cardiomyopathy (HCC)    EF as low as 15-20% in 02/2017. Myoview at that time showed no perfusion defects. EF improved to 50-55% in 06/2017 with GDMT.   Prediabetes    Shingles     Past Surgical History:  Procedure Laterality Date   TUBAL LIGATION      Current Medications: Current Meds  Medication Sig   aspirin EC 81 MG EC tablet Take 1 tablet (81 mg total) by mouth daily.   carvedilol (COREG) 25 MG tablet TAKE 1 TABLET BY MOUTH TWICE DAILY WITH A MEAL .   docusate sodium (COLACE) 100 MG capsule Take 100 mg by mouth daily.   furosemide (LASIX) 20 MG tablet Take one tablet-po PRN forSOB AND WT GAIN OF >3#qd/>5#WK   Garlic 2000 MG CAPS Take 4,000 mg by mouth daily.   Multiple Vitamins-Minerals (CENTRUM SILVER 50+WOMEN PO) Take 1 tablet by mouth daily.   OVER THE COUNTER MEDICATION Take 1 capsule by mouth 2 (two) times daily. [place] Capsule   sacubitril-valsartan (ENTRESTO) 97-103 MG PATIENT HAS TO SCHEDULE APPOINTMENT FOR FUTURE REFILLS. 1 ST ATTEMPT. Take 1 tablet by mouth twice daily     Allergies:   Patient has no known allergies.  Social History   Socioeconomic History   Marital status: Divorced    Spouse name: Not on file   Number of children: Not on file   Years of education: Not on file   Highest education level: Not on file  Occupational History   Occupation: Psychologist, forensic  Tobacco Use   Smoking status: Never   Smokeless tobacco: Never  Substance and Sexual Activity   Alcohol use: No   Drug use: No   Sexual activity: Not on file  Other Topics Concern   Not on file  Social History Narrative   Not on file   Social Determinants of Health   Financial Resource Strain: Not on file  Food Insecurity: Not on file  Transportation Needs:  Not on file  Physical Activity: Not on file  Stress: Not on file  Social Connections: Not on file     Family History: The patient's family history includes CAD in her father and mother; CVA in her brother; Valvular heart disease in her mother.  ROS:   Please see the history of present illness.     EKGs/Labs/Other Studies Reviewed:    The following studies were reviewed today:  Echocardiogram 02/16/2017: Study Conclusions: - Left ventricle: The cavity size was mildly dilated. Systolic    function was severely reduced. The estimated ejection fraction    was in the range of 15% to 20%. Diffuse hypokinesis. Features are    consistent with a pseudonormal left ventricular filling pattern,    with concomitant abnormal relaxation and increased filling    pressure (grade 2 diastolic dysfunction). Doppler parameters are    consistent with high ventricular filling pressure.  - Aortic valve: Transvalvular velocity was within the normal range.    There was no stenosis. There was no regurgitation.  - Mitral valve: Transvalvular velocity was within the normal range.    There was no evidence for stenosis. There was mild regurgitation.    Valve area by continuity equation (using LVOT flow): 0.11 cm^2.    Effective regurgitant orifice (PISA): 0.11 cm^2. Regurgitant    volume (PISA): 16.6 ml.  - Left atrium: The atrium was severely dilated.  - Right ventricle: The cavity size was normal. Wall thickness was    normal. Systolic function was normal.  - Tricuspid valve: There was mild regurgitation.  - Pulmonary arteries: Systolic pressure was within the normal    range. PA peak pressure: 33 mm Hg (S).  _______________  Myoview 03/02/2017: Nuclear stress EF: 25%. The left ventricular ejection fraction is severely decreased (<30%). There was no ST segment deviation noted during stress. This is a high risk study.   High risk stress nuclear study due to severely reduced left ventricular global  systolic function. No perfusion abnormalities are seen. The left ventricle is dilated. The findings are most consistent with nonischemic cardiomyopathy. _______________  Echocardiogram 06/18/2017: Study Conclusions: - Left ventricle: The cavity size was normal. Systolic function was    at the lower limits of normal. The estimated ejection fraction    was in the range of 50% to 55%. Wall motion was normal; there    were no regional wall motion abnormalities. Doppler parameters    are consistent with abnormal left ventricular relaxation (grade 1    diastolic dysfunction).  - Left atrium: The atrium was mildly dilated.   Impressions: - There is substantial improvement in left ventricular function    since February 16, 2017.    EKG:  EKG ordered today. EKG personally reviewed and  demonstrates normal sinus rhythm with sinus arrhythmia, rate 67 bpm, with LVH and abnormal T waves in leads III and aVF. Left axis deviation. Normal PR and QRS intervals. QTc 431 ms. No significant changes compared to prior tracing in 10/2019.  Recent Labs: No results found for requested labs within last 8760 hours.  Recent Lipid Panel    Component Value Date/Time   CHOL 204 (H) 10/19/2019 1040   TRIG 58 10/19/2019 1040   HDL 80 10/19/2019 1040   CHOLHDL 2.6 10/19/2019 1040   CHOLHDL 3.0 02/17/2017 0307   VLDL 23 02/17/2017 0307   LDLCALC 113 (H) 10/19/2019 1040    Physical Exam:    Vital Signs: BP (!) 138/94 (BP Location: Left Arm, Patient Position: Sitting, Cuff Size: Large)   Pulse 67   Ht 5\' 6"  (1.676 m)   Wt 200 lb (90.7 kg)   BMI 32.28 kg/m     Wt Readings from Last 3 Encounters:  03/20/21 200 lb (90.7 kg)  10/19/19 204 lb (92.5 kg)  10/19/18 189 lb (85.7 kg)     General: 70 y.o. African American female in no acute distress. HEENT: Normocephalic and atraumatic. Sclera clear.  Neck: Supple. No carotid bruits. No JVD. Heart: RRR. Distinct S1 and S2. No murmurs, gallops, or rubs. Radial and  distal pedal pulses 2+ and equal bilaterally. Lungs: No increased work of breathing. Clear to ausculation bilaterally. No wheezes, rhonchi, or rales.  Abdomen: Soft, non-distended, and non-tender to palpation.  MSK: Normal strength and tone for age. Extremities: No lower extremity edema.    Skin: Warm and dry. Neuro: Alert and oriented x3. No focal deficits. Psych: Normal affect. Responds appropriately.  Assessment:    1. Nonischemic cardiomyopathy (HCC)   2. Chronic combined systolic and diastolic CHF (congestive heart failure) (HCC)   3. Primary hypertension   4. Dyslipidemia   5. Prediabetes   6. Numbness and tingling in right hand     Plan:    Non-Ischemic Cardiomyopathy Chronic Combined CHF - LVEF as low as 15-20% in 2018 but has since normalized. Myoview at that time normal. EF has since normalized with GDMT. Echo in 06/2017 showed LVEF of 50-55% with normal wall motion and grade 1 diastolic dysfunction. - Appears euvolemic on exam.  - Continue Lasix 20mg  daily PRN for weight gain or increased shortness of breath. - Continue Entresto 97-103mg  twice daily. - Continue Coreg 25mg  twice daily.  - Discussed importance of monitoring weights and and sodium/fluid restrictions.  - Will check CMET in 2 weeks when she comes in for fasting lipid panel.  Hypertension - BP elevated in the office today. However, she states it is normally below goal at home. - Continue medications for CHF as above. - Advised patient to keep log of BP/HR and drop this off when she comes in for labs in 2 weeks.  Dyslipidemia - Last lipid panel in 10/2019: Total Cholesterol 204, Triglycerides 58, HDL 80, LDL 113. - Not on any cholesterol medications. - Patient is not fasting today. She will come back in 2 weeks for fasting lipid panel/CMET.  Prediabetes - Hemoglobin A1c 6.0% in 10/2019. - Can focus on lifestyle modifications for now. - Follow-up with PCP  Right Hand Numbness/Tingling - Patient reports  numbness/tingling in fingers on right hand for the last couple of weeks. No other numbness/tingling.  - She has strong and equal radial pulses bilaterally. BP equal in both arms. Good cap refill. - I don't think this is a vascular problem at  this point. She has no known history of any cervical spine issues; however, she does state that her neck has always very stiff.  She has seen a chiropractor for this in the past. I wonder if this is a cervical neck problem. Recommended following up with PCP.  Disposition: Follow up in 1 year with Dr. Royann Shivers.    Medication Adjustments/Labs and Tests Ordered: Current medicines are reviewed at length with the patient today.  Concerns regarding medicines are outlined above.  Orders Placed This Encounter  Procedures   Comprehensive metabolic panel   Lipid panel   EKG 12-Lead    No orders of the defined types were placed in this encounter.   Patient Instructions  Medication Instructions:  Your physician recommends that you continue on your current medications as directed. Please refer to the Current Medication list given to you today.  *If you need a refill on your cardiac medications before your next appointment, please call your pharmacy*  Lab Work: Your physician recommends that you return for lab work in 2 WEEKS (04/03/21):  CMET Fasting Lipid Panel-DO NOT EAT OR DRINK PAST MIDNIGHT. OKAY TO HAVE WATER  If you have labs (blood work) drawn today and your tests are completely normal, you will receive your results only by: MyChart Message (if you have MyChart) OR A paper copy in the mail If you have any lab test that is abnormal or we need to change your treatment, we will call you to review the results.  Testing/Procedures: NONE ordered at this time of appointment   Follow-Up: At [facility], you and your health needs are our priority.  As part of our continuing mission to provide you with exceptional heart care, we have created designated  Provider Care Teams.  These Care Teams include your primary Cardiologist (physician) and Advanced Practice Providers (APPs -  Physician Assistants and Nurse Practitioners) who all work together to provide you with the care you need, when you need it.  We recommend signing up for the patient portal called "MyChart".  Sign up information is provided on this After Visit Summary.  MyChart is used to connect with patients for Virtual Visits (Telemedicine).  Patients are able to view lab/test results, encounter notes, upcoming appointments, etc.  Non-urgent messages can be sent to your provider as well.   To learn more about what you can do with MyChart, go to [email or web address].    Your next appointment:   1 year(s)  The format for your next appointment:   In Person  Provider:   Thurmon Fair, MD   Other Instructions Monitor blood pressure (BP) and heart rate (HR) at home for 2 weeks. Bring BP log when you come into the office for blood work    Signed, Corrin Parker, PA-C  03/20/2021 12:36 PM    [facility]

## 2021-03-20 ENCOUNTER — Other Ambulatory Visit: Payer: Self-pay

## 2021-03-20 ENCOUNTER — Encounter: Payer: Self-pay | Admitting: Student

## 2021-03-20 ENCOUNTER — Ambulatory Visit: Payer: Medicare Other | Admitting: Student

## 2021-03-20 VITALS — BP 138/94 | HR 67 | Ht 66.0 in | Wt 200.0 lb

## 2021-03-20 DIAGNOSIS — E785 Hyperlipidemia, unspecified: Secondary | ICD-10-CM | POA: Insufficient documentation

## 2021-03-20 DIAGNOSIS — R202 Paresthesia of skin: Secondary | ICD-10-CM

## 2021-03-20 DIAGNOSIS — I10 Essential (primary) hypertension: Secondary | ICD-10-CM | POA: Diagnosis not present

## 2021-03-20 DIAGNOSIS — R7303 Prediabetes: Secondary | ICD-10-CM | POA: Diagnosis not present

## 2021-03-20 DIAGNOSIS — I5042 Chronic combined systolic (congestive) and diastolic (congestive) heart failure: Secondary | ICD-10-CM | POA: Diagnosis not present

## 2021-03-20 DIAGNOSIS — R200 Anesthesia of skin: Secondary | ICD-10-CM

## 2021-03-20 DIAGNOSIS — I428 Other cardiomyopathies: Secondary | ICD-10-CM

## 2021-03-20 NOTE — Patient Instructions (Addendum)
Medication Instructions:  Your physician recommends that you continue on your current medications as directed. Please refer to the Current Medication list given to you today.  *If you need a refill on your cardiac medications before your next appointment, please call your pharmacy*  Lab Work: Your physician recommends that you return for lab work in 2 WEEKS (04/03/21):  CMET Fasting Lipid Panel-DO NOT EAT OR DRINK PAST MIDNIGHT. OKAY TO HAVE WATER  If you have labs (blood work) drawn today and your tests are completely normal, you will receive your results only by: MyChart Message (if you have MyChart) OR A paper copy in the mail If you have any lab test that is abnormal or we need to change your treatment, we will call you to review the results.  Testing/Procedures: NONE ordered at this time of appointment   Follow-Up: At [facility], you and your health needs are our priority.  As part of our continuing mission to provide you with exceptional heart care, we have created designated Provider Care Teams.  These Care Teams include your primary Cardiologist (physician) and Advanced Practice Providers (APPs -  Physician Assistants and Nurse Practitioners) who all work together to provide you with the care you need, when you need it.  We recommend signing up for the patient portal called "MyChart".  Sign up information is provided on this After Visit Summary.  MyChart is used to connect with patients for Virtual Visits (Telemedicine).  Patients are able to view lab/test results, encounter notes, upcoming appointments, etc.  Non-urgent messages can be sent to your provider as well.   To learn more about what you can do with MyChart, go to [email or web address].    Your next appointment:   1 year(s)  The format for your next appointment:   In Person  Provider:   Thurmon Fair, MD   Other Instructions Monitor blood pressure (BP) and heart rate (HR) at home for 2 weeks. Bring BP log  when you come into the office for blood work

## 2021-03-28 DIAGNOSIS — H2513 Age-related nuclear cataract, bilateral: Secondary | ICD-10-CM | POA: Diagnosis not present

## 2021-04-04 ENCOUNTER — Other Ambulatory Visit: Payer: Self-pay | Admitting: Cardiovascular Disease

## 2021-04-04 DIAGNOSIS — E785 Hyperlipidemia, unspecified: Secondary | ICD-10-CM | POA: Diagnosis not present

## 2021-04-04 DIAGNOSIS — I5042 Chronic combined systolic (congestive) and diastolic (congestive) heart failure: Secondary | ICD-10-CM | POA: Diagnosis not present

## 2021-04-04 LAB — COMPREHENSIVE METABOLIC PANEL
ALT: 14 IU/L (ref 0–32)
AST: 14 IU/L (ref 0–40)
Albumin/Globulin Ratio: 1.4 (ref 1.2–2.2)
Albumin: 4.2 g/dL (ref 3.7–4.7)
Alkaline Phosphatase: 84 IU/L (ref 44–121)
BUN/Creatinine Ratio: 17 (ref 12–28)
BUN: 16 mg/dL (ref 8–27)
Bilirubin Total: 0.5 mg/dL (ref 0.0–1.2)
CO2: 24 mmol/L (ref 20–29)
Calcium: 9.6 mg/dL (ref 8.7–10.3)
Chloride: 104 mmol/L (ref 96–106)
Creatinine, Ser: 0.96 mg/dL (ref 0.57–1.00)
Globulin, Total: 3.1 g/dL (ref 1.5–4.5)
Glucose: 90 mg/dL (ref 70–99)
Potassium: 4.9 mmol/L (ref 3.5–5.2)
Sodium: 142 mmol/L (ref 134–144)
Total Protein: 7.3 g/dL (ref 6.0–8.5)
eGFR: 63 mL/min/{1.73_m2} (ref >59)

## 2021-04-04 LAB — LIPID PANEL
Chol/HDL Ratio: 2.6 ratio (ref 0.0–4.4)
Cholesterol, Total: 196 mg/dL (ref 100–199)
HDL: 74 mg/dL (ref >39)
LDL Chol Calc (NIH): 110 mg/dL — ABNORMAL HIGH (ref 0–99)
Triglycerides: 65 mg/dL (ref 0–149)
VLDL Cholesterol Cal: 12 mg/dL (ref 5–40)

## 2021-04-11 ENCOUNTER — Telehealth: Payer: Self-pay | Admitting: Student

## 2021-04-11 NOTE — Telephone Encounter (Signed)
Tried to return patient's call but got her voicemail. Left a message that I would try to call back again early next week.  Corrin Parker, PA-C 04/11/2021 4:33 PM

## 2021-04-11 NOTE — Telephone Encounter (Signed)
Patient is returning call.  °

## 2021-04-11 NOTE — Telephone Encounter (Signed)
   Patient returned BP/HR log to the office for review. However, only systolic number is listed. Will include readings below: - 10/7: BP 120, HR 84 - 10/9: BP 116, HR 81 - 10/12: BP 123, HR 84 - 10/14: BP 122, HR 79 - 10/15: BP 124, HR 86 - 10/19: BP 112, HR 75  Called patient to ask about her diastolic BP as this is what was elevated more at her last visit. Left message to call back.  Corrin Parker, PA-C 04/11/2021 3:17 PM

## 2021-04-14 NOTE — Telephone Encounter (Signed)
   Called and spoke with patient. She states the numbers listed in the HR column were actually her diastolic BP. Diastolic BP slightly above goal. She is already on Entresto 97-103mg  twice daily and Coreg 25mg  twice daily. Will continue current medications for now. Recommended patient watch sodium intake and increase activity. Advised patient that BP goal is <130/80 and asked patient to let know if she notices BP increasing. She voiced understanding and agreed.  [place], PA-C 04/14/2021 1:32 PM

## 2021-05-05 ENCOUNTER — Other Ambulatory Visit: Payer: Self-pay | Admitting: Cardiovascular Disease

## 2021-05-13 DIAGNOSIS — Z23 Encounter for immunization: Secondary | ICD-10-CM | POA: Diagnosis not present

## 2021-05-13 DIAGNOSIS — I10 Essential (primary) hypertension: Secondary | ICD-10-CM | POA: Diagnosis not present

## 2021-05-13 DIAGNOSIS — Z0000 Encounter for general adult medical examination without abnormal findings: Secondary | ICD-10-CM | POA: Diagnosis not present

## 2021-05-13 DIAGNOSIS — R7303 Prediabetes: Secondary | ICD-10-CM | POA: Diagnosis not present

## 2021-05-13 DIAGNOSIS — N183 Chronic kidney disease, stage 3 unspecified: Secondary | ICD-10-CM | POA: Diagnosis not present

## 2021-05-13 DIAGNOSIS — Z862 Personal history of diseases of the blood and blood-forming organs and certain disorders involving the immune mechanism: Secondary | ICD-10-CM | POA: Diagnosis not present

## 2021-05-13 DIAGNOSIS — E785 Hyperlipidemia, unspecified: Secondary | ICD-10-CM | POA: Diagnosis not present

## 2021-05-16 ENCOUNTER — Other Ambulatory Visit: Payer: Self-pay | Admitting: Family Medicine

## 2021-05-16 DIAGNOSIS — E2839 Other primary ovarian failure: Secondary | ICD-10-CM

## 2021-05-23 ENCOUNTER — Other Ambulatory Visit: Payer: Self-pay | Admitting: Family Medicine

## 2021-05-23 DIAGNOSIS — Z1231 Encounter for screening mammogram for malignant neoplasm of breast: Secondary | ICD-10-CM

## 2021-06-19 ENCOUNTER — Other Ambulatory Visit: Payer: Self-pay | Admitting: Cardiovascular Disease

## 2021-06-26 ENCOUNTER — Ambulatory Visit
Admission: RE | Admit: 2021-06-26 | Discharge: 2021-06-26 | Disposition: A | Discharge status: Home / Self Care | Payer: Medicare Other | Source: Ambulatory Visit | Attending: Family Medicine | Admitting: Family Medicine

## 2021-06-26 DIAGNOSIS — Z1231 Encounter for screening mammogram for malignant neoplasm of breast: Secondary | ICD-10-CM

## 2021-07-07 ENCOUNTER — Ambulatory Visit
Admission: RE | Admit: 2021-07-07 | Discharge: 2021-07-07 | Disposition: A | Discharge status: Home / Self Care | Payer: Medicare Other | Source: Ambulatory Visit | Attending: Family Medicine | Admitting: Family Medicine

## 2021-07-07 DIAGNOSIS — E2839 Other primary ovarian failure: Secondary | ICD-10-CM

## 2021-07-07 DIAGNOSIS — Z780 Asymptomatic menopausal state: Secondary | ICD-10-CM | POA: Diagnosis not present

## 2021-07-08 ENCOUNTER — Other Ambulatory Visit: Payer: Self-pay | Admitting: Cardiovascular Disease

## 2021-09-13 ENCOUNTER — Other Ambulatory Visit: Payer: Self-pay | Admitting: Cardiovascular Disease

## 2022-02-22 ENCOUNTER — Other Ambulatory Visit: Payer: Self-pay | Admitting: Cardiovascular Disease

## 2022-04-07 ENCOUNTER — Other Ambulatory Visit: Payer: Self-pay | Admitting: Cardiovascular Disease

## 2022-04-07 NOTE — Telephone Encounter (Signed)
Rx refill sent to pharmacy. 

## 2022-05-26 ENCOUNTER — Telehealth: Payer: Self-pay | Admitting: Cardiovascular Disease

## 2022-05-26 MED ORDER — CARVEDILOL 25 MG PO TABS: 25.0000 mg | ORAL_TABLET | Freq: Two times a day (BID) | ORAL | 0 refills | Status: AC

## 2022-05-26 MED ORDER — ENTRESTO 97-103 MG PO TABS
ORAL_TABLET | ORAL | 0 refills | Status: AC
Start: 2022-05-26 — End: ?

## 2022-05-26 NOTE — Telephone Encounter (Signed)
*  STAT* If patient is at the pharmacy, call can be transferred to refill team.   1. Which medications need to be refilled? (please list name of each medication and dose if known)   sacubitril-valsartan (ENTRESTO) 97-103 MG  carvedilol (COREG) 25 MG tablet   2. Which pharmacy/location (including street and city if local pharmacy) is medication to be sent to?  [facility] [address] - [place], [place] - [address]   3. Do they need a 30 day or 90 day supply?  90 day  Patient stated she is running out of these medications.
